# Patient Record
Sex: Female | Born: 1955 | Race: White | Hispanic: No | Smoking: Never smoker
Health system: Southern US, Community
[De-identification: ages and names within clinical notes are randomized; demographics above are authoritative.]

## PROBLEM LIST (undated history)

## (undated) DIAGNOSIS — G8929 Other chronic pain: Secondary | ICD-10-CM

## (undated) DIAGNOSIS — I10 Essential (primary) hypertension: Secondary | ICD-10-CM

## (undated) DIAGNOSIS — M545 Low back pain: Secondary | ICD-10-CM

## (undated) DIAGNOSIS — Z9889 Other specified postprocedural states: Secondary | ICD-10-CM

## (undated) DIAGNOSIS — K219 Gastro-esophageal reflux disease without esophagitis: Secondary | ICD-10-CM

## (undated) DIAGNOSIS — M1990 Unspecified osteoarthritis, unspecified site: Secondary | ICD-10-CM

## (undated) DIAGNOSIS — T783XXA Angioneurotic edema, initial encounter: Secondary | ICD-10-CM

## (undated) DIAGNOSIS — E7800 Pure hypercholesterolemia, unspecified: Secondary | ICD-10-CM

## (undated) DIAGNOSIS — R112 Nausea with vomiting, unspecified: Secondary | ICD-10-CM

## (undated) DIAGNOSIS — L509 Urticaria, unspecified: Secondary | ICD-10-CM

## (undated) HISTORY — DX: Urticaria, unspecified: L50.9

## (undated) HISTORY — PX: TONSILLECTOMY: SUR1361

## (undated) HISTORY — DX: Angioneurotic edema, initial encounter: T78.3XXA

---

## 1996-11-17 HISTORY — PX: DILATION AND CURETTAGE OF UTERUS: SHX78

## 1998-02-23 ENCOUNTER — Other Ambulatory Visit: Admission: RE | Admit: 1998-02-23 | Discharge: 1998-02-23 | Payer: Self-pay | Admitting: Obstetrics and Gynecology

## 1998-05-11 ENCOUNTER — Other Ambulatory Visit: Admission: RE | Admit: 1998-05-11 | Discharge: 1998-05-11 | Payer: Self-pay | Admitting: Obstetrics and Gynecology

## 1998-11-14 ENCOUNTER — Other Ambulatory Visit: Admission: RE | Admit: 1998-11-14 | Discharge: 1998-11-14 | Payer: Self-pay | Admitting: Obstetrics and Gynecology

## 1998-11-17 HISTORY — PX: VAGINAL HYSTERECTOMY: SUR661

## 1999-05-27 ENCOUNTER — Inpatient Hospital Stay (HOSPITAL_COMMUNITY): Admission: RE | Admit: 1999-05-27 | Discharge: 1999-05-28 | Payer: Self-pay | Admitting: Obstetrics and Gynecology

## 2000-03-06 ENCOUNTER — Other Ambulatory Visit: Admission: RE | Admit: 2000-03-06 | Discharge: 2000-03-06 | Payer: Self-pay | Admitting: Obstetrics and Gynecology

## 2000-04-01 ENCOUNTER — Other Ambulatory Visit: Admission: RE | Admit: 2000-04-01 | Discharge: 2000-04-01 | Payer: Self-pay | Admitting: Obstetrics and Gynecology

## 2000-04-01 ENCOUNTER — Encounter (INDEPENDENT_AMBULATORY_CARE_PROVIDER_SITE_OTHER): Payer: Self-pay

## 2000-12-22 ENCOUNTER — Other Ambulatory Visit: Admission: RE | Admit: 2000-12-22 | Discharge: 2000-12-22 | Payer: Self-pay | Admitting: Obstetrics and Gynecology

## 2001-04-28 ENCOUNTER — Other Ambulatory Visit: Admission: RE | Admit: 2001-04-28 | Discharge: 2001-04-28 | Payer: Self-pay | Admitting: Obstetrics and Gynecology

## 2001-11-11 ENCOUNTER — Other Ambulatory Visit: Admission: RE | Admit: 2001-11-11 | Discharge: 2001-11-11 | Payer: Self-pay | Admitting: Obstetrics and Gynecology

## 2002-05-11 ENCOUNTER — Other Ambulatory Visit: Admission: RE | Admit: 2002-05-11 | Discharge: 2002-05-11 | Payer: Self-pay | Admitting: Obstetrics and Gynecology

## 2002-12-30 ENCOUNTER — Other Ambulatory Visit: Admission: RE | Admit: 2002-12-30 | Discharge: 2002-12-30 | Payer: Self-pay | Admitting: Obstetrics and Gynecology

## 2003-08-04 ENCOUNTER — Other Ambulatory Visit: Admission: RE | Admit: 2003-08-04 | Discharge: 2003-08-04 | Payer: Self-pay | Admitting: Obstetrics and Gynecology

## 2003-11-18 HISTORY — PX: REFRACTIVE SURGERY: SHX103

## 2004-08-05 ENCOUNTER — Other Ambulatory Visit: Admission: RE | Admit: 2004-08-05 | Discharge: 2004-08-05 | Payer: Self-pay | Admitting: Obstetrics and Gynecology

## 2006-01-30 ENCOUNTER — Other Ambulatory Visit: Admission: RE | Admit: 2006-01-30 | Discharge: 2006-01-30 | Payer: Self-pay | Admitting: Obstetrics and Gynecology

## 2007-06-29 ENCOUNTER — Encounter: Admission: RE | Admit: 2007-06-29 | Discharge: 2007-06-29 | Payer: Self-pay | Admitting: Obstetrics and Gynecology

## 2008-01-03 ENCOUNTER — Encounter: Admission: RE | Admit: 2008-01-03 | Discharge: 2008-01-03 | Payer: Self-pay | Admitting: Obstetrics and Gynecology

## 2011-09-18 HISTORY — PX: KNEE ARTHROSCOPY W/ MENISCAL REPAIR: SHX1877

## 2012-06-03 ENCOUNTER — Encounter (HOSPITAL_COMMUNITY): Payer: Self-pay | Admitting: Pharmacy Technician

## 2012-06-07 ENCOUNTER — Other Ambulatory Visit: Payer: Self-pay | Admitting: Orthopedic Surgery

## 2012-06-08 ENCOUNTER — Encounter (HOSPITAL_COMMUNITY)
Admission: RE | Admit: 2012-06-08 | Discharge: 2012-06-08 | Disposition: A | Discharge status: Home / Self Care | Payer: Managed Care, Other (non HMO) | Source: Ambulatory Visit | Attending: Orthopedic Surgery | Admitting: Orthopedic Surgery

## 2012-06-08 ENCOUNTER — Encounter (HOSPITAL_COMMUNITY): Payer: Self-pay

## 2012-06-08 HISTORY — DX: Unspecified osteoarthritis, unspecified site: M19.90

## 2012-06-08 HISTORY — DX: Essential (primary) hypertension: I10

## 2012-06-08 HISTORY — DX: Nausea with vomiting, unspecified: R11.2

## 2012-06-08 HISTORY — DX: Pure hypercholesterolemia, unspecified: E78.00

## 2012-06-08 HISTORY — DX: Nausea with vomiting, unspecified: Z98.890

## 2012-06-08 LAB — DIFFERENTIAL
Basophils Relative: 1 % (ref 0–1)
Eosinophils Absolute: 0.4 10*3/uL (ref 0.0–0.7)
Monocytes Absolute: 0.8 10*3/uL (ref 0.1–1.0)
Monocytes Relative: 10 % (ref 3–12)
Neutrophils Relative %: 47 % (ref 43–77)

## 2012-06-08 LAB — CBC
HCT: 41.9 % (ref 36.0–46.0)
RBC: 4.64 MIL/uL (ref 3.87–5.11)
RDW: 13.1 % (ref 11.5–15.5)
WBC: 8.6 10*3/uL (ref 4.0–10.5)

## 2012-06-08 LAB — APTT: aPTT: 34 seconds (ref 24–37)

## 2012-06-08 LAB — COMPREHENSIVE METABOLIC PANEL
AST: 32 U/L (ref 0–37)
Albumin: 4.5 g/dL (ref 3.5–5.2)
Alkaline Phosphatase: 68 U/L (ref 39–117)
BUN: 13 mg/dL (ref 6–23)
CO2: 27 mEq/L (ref 19–32)
Chloride: 100 mEq/L (ref 96–112)
Potassium: 3.8 mEq/L (ref 3.5–5.1)
Total Bilirubin: 0.3 mg/dL (ref 0.3–1.2)

## 2012-06-08 LAB — ABO/RH: ABO/RH(D): B POS

## 2012-06-08 LAB — URINE MICROSCOPIC-ADD ON

## 2012-06-08 LAB — URINALYSIS, ROUTINE W REFLEX MICROSCOPIC
Bilirubin Urine: NEGATIVE
Glucose, UA: NEGATIVE mg/dL
Hgb urine dipstick: NEGATIVE
Ketones, ur: NEGATIVE mg/dL
pH: 6 (ref 5.0–8.0)

## 2012-06-08 LAB — TYPE AND SCREEN: ABO/RH(D): B POS

## 2012-06-08 NOTE — Pre-Procedure Instructions (Signed)
20 Jessica Williams  06/08/2012   Your procedure is scheduled on:  Monday July 29 @ 8:45  Report to [facility] at 6:45 AM.  Call this number if you have problems the morning of surgery: [phone]   Remember:   Do not eat food:After Midnight.  May have clear liquids:until Midnight .  Clear liquids include soda, tea, black coffee, apple or grape juice, broth.  Take these medicines the morning of surgery with A SIP OF WATER: Atenolol   Do not wear jewelry, make-up or nail polish.  Do not wear lotions, powders, or perfumes. You may wear deodorant.  Do not shave 48 hours prior to surgery. Men may shave face and neck.  Do not bring valuables to the hospital.  Contacts, dentures or bridgework may not be worn into surgery.  Leave suitcase in the car. After surgery it may be brought to your room.  For patients admitted to the hospital, checkout time is 11:00 AM the day of discharge.   Patients discharged the day of surgery will not be allowed to drive home.  Name and phone number of your driver: NA  Special Instructions: Incentive Spirometry - Practice and bring it with you on the day of surgery. and CHG Shower Use Special Wash: 1/2 bottle night before surgery and 1/2 bottle morning of surgery.   Please read over the following fact sheets that you were given: Pain Booklet, Coughing and Deep Breathing, Blood Transfusion Information, Total Joint Packet and Surgical Site Infection Prevention

## 2012-06-09 LAB — URINE CULTURE: Colony Count: NO GROWTH

## 2012-06-13 MED ORDER — CEFAZOLIN SODIUM-DEXTROSE 2-3 GM-% IV SOLR
2.0000 g | INTRAVENOUS | Status: AC
  Administered 2012-06-14: 2 g via INTRAVENOUS
  Filled 2012-06-13: qty 50

## 2012-06-14 ENCOUNTER — Inpatient Hospital Stay (HOSPITAL_COMMUNITY)
Admission: RE | Admit: 2012-06-14 | Discharge: 2012-06-16 | DRG: 470 | Disposition: A | Discharge status: Home Health | Payer: Managed Care, Other (non HMO) | Source: Ambulatory Visit | Attending: Orthopedic Surgery | Admitting: Orthopedic Surgery

## 2012-06-14 ENCOUNTER — Encounter (HOSPITAL_COMMUNITY): Payer: Self-pay | Admitting: Anesthesiology

## 2012-06-14 ENCOUNTER — Encounter (HOSPITAL_COMMUNITY)
Admission: RE | Disposition: A | Discharge status: Home Health | Payer: Self-pay | Source: Ambulatory Visit | Attending: Orthopedic Surgery

## 2012-06-14 ENCOUNTER — Ambulatory Visit (HOSPITAL_COMMUNITY): Payer: Managed Care, Other (non HMO) | Admitting: Anesthesiology

## 2012-06-14 ENCOUNTER — Encounter (HOSPITAL_COMMUNITY): Payer: Self-pay | Admitting: *Deleted

## 2012-06-14 DIAGNOSIS — Z01812 Encounter for preprocedural laboratory examination: Secondary | ICD-10-CM

## 2012-06-14 DIAGNOSIS — M1710 Unilateral primary osteoarthritis, unspecified knee: Principal | ICD-10-CM | POA: Diagnosis present

## 2012-06-14 DIAGNOSIS — K219 Gastro-esophageal reflux disease without esophagitis: Secondary | ICD-10-CM | POA: Diagnosis present

## 2012-06-14 DIAGNOSIS — M1711 Unilateral primary osteoarthritis, right knee: Secondary | ICD-10-CM

## 2012-06-14 DIAGNOSIS — I10 Essential (primary) hypertension: Secondary | ICD-10-CM | POA: Diagnosis present

## 2012-06-14 DIAGNOSIS — Z79899 Other long term (current) drug therapy: Secondary | ICD-10-CM

## 2012-06-14 DIAGNOSIS — Z01811 Encounter for preprocedural respiratory examination: Secondary | ICD-10-CM

## 2012-06-14 DIAGNOSIS — E7800 Pure hypercholesterolemia, unspecified: Secondary | ICD-10-CM | POA: Diagnosis present

## 2012-06-14 DIAGNOSIS — IMO0002 Reserved for concepts with insufficient information to code with codable children: Principal | ICD-10-CM | POA: Diagnosis present

## 2012-06-14 DIAGNOSIS — Z01818 Encounter for other preprocedural examination: Secondary | ICD-10-CM

## 2012-06-14 DIAGNOSIS — D62 Acute posthemorrhagic anemia: Secondary | ICD-10-CM | POA: Diagnosis not present

## 2012-06-14 HISTORY — DX: Gastro-esophageal reflux disease without esophagitis: K21.9

## 2012-06-14 HISTORY — PX: TOTAL KNEE ARTHROPLASTY: SHX125

## 2012-06-14 SURGERY — ARTHROPLASTY, KNEE, TOTAL
Anesthesia: General | Site: Knee | Laterality: Right | Wound class: Clean

## 2012-06-14 MED ORDER — SENNOSIDES-DOCUSATE SODIUM 8.6-50 MG PO TABS: 1.0000 | ORAL_TABLET | Freq: Every evening | ORAL | Status: DC | PRN

## 2012-06-14 MED ORDER — BUPIVACAINE-EPINEPHRINE 0.25% -1:200000 IJ SOLN
INTRAMUSCULAR | Status: DC | PRN
  Administered 2012-06-14: 20 mL

## 2012-06-14 MED ORDER — ATORVASTATIN CALCIUM 40 MG PO TABS
40.0000 mg | ORAL_TABLET | Freq: Every day | ORAL | Status: DC
  Administered 2012-06-14 – 2012-06-15 (×2): 40 mg via ORAL
  Filled 2012-06-14 (×3): qty 1

## 2012-06-14 MED ORDER — BISACODYL 5 MG PO TBEC: 5.0000 mg | DELAYED_RELEASE_TABLET | Freq: Every day | ORAL | Status: DC | PRN

## 2012-06-14 MED ORDER — METHOCARBAMOL 100 MG/ML IJ SOLN
500.0000 mg | Freq: Four times a day (QID) | INTRAVENOUS | Status: DC | PRN
  Administered 2012-06-14: 500 mg via INTRAVENOUS
  Filled 2012-06-14: qty 5

## 2012-06-14 MED ORDER — ONDANSETRON HCL 4 MG PO TABS: 4.0000 mg | ORAL_TABLET | Freq: Four times a day (QID) | ORAL | Status: DC | PRN

## 2012-06-14 MED ORDER — SODIUM CHLORIDE 0.9 % IR SOLN
Status: DC | PRN
  Administered 2012-06-14: 3000 mL
  Administered 2012-06-14: 1000 mL

## 2012-06-14 MED ORDER — ATENOLOL 50 MG PO TABS
50.0000 mg | ORAL_TABLET | Freq: Every day | ORAL | Status: DC
  Administered 2012-06-15 – 2012-06-16 (×2): 50 mg via ORAL
  Filled 2012-06-14 (×2): qty 1

## 2012-06-14 MED ORDER — BUPIVACAINE-EPINEPHRINE PF 0.25-1:200000 % IJ SOLN
INTRAMUSCULAR | Status: AC
  Filled 2012-06-14: qty 30

## 2012-06-14 MED ORDER — METOCLOPRAMIDE HCL 5 MG/ML IJ SOLN
5.0000 mg | Freq: Three times a day (TID) | INTRAMUSCULAR | Status: DC | PRN
  Filled 2012-06-14: qty 2

## 2012-06-14 MED ORDER — ONDANSETRON HCL 4 MG/2ML IJ SOLN
INTRAMUSCULAR | Status: DC | PRN
  Administered 2012-06-14: 4 mg via INTRAVENOUS

## 2012-06-14 MED ORDER — HYDROMORPHONE HCL PF 1 MG/ML IJ SOLN
0.5000 mg | INTRAMUSCULAR | Status: DC | PRN
  Administered 2012-06-14: 0.5 mg via INTRAVENOUS

## 2012-06-14 MED ORDER — BUPIVACAINE-EPINEPHRINE PF 0.5-1:200000 % IJ SOLN
INTRAMUSCULAR | Status: DC | PRN
  Administered 2012-06-14: 30 mL

## 2012-06-14 MED ORDER — ONDANSETRON HCL 4 MG/2ML IJ SOLN
4.0000 mg | Freq: Four times a day (QID) | INTRAMUSCULAR | Status: DC | PRN
  Administered 2012-06-14: 4 mg via INTRAVENOUS
  Filled 2012-06-14: qty 2

## 2012-06-14 MED ORDER — DOCUSATE SODIUM 100 MG PO CAPS
100.0000 mg | ORAL_CAPSULE | Freq: Two times a day (BID) | ORAL | Status: DC
  Administered 2012-06-14 – 2012-06-16 (×5): 100 mg via ORAL
  Filled 2012-06-14 (×6): qty 1

## 2012-06-14 MED ORDER — DEXAMETHASONE SODIUM PHOSPHATE 10 MG/ML IJ SOLN
INTRAMUSCULAR | Status: DC | PRN
  Administered 2012-06-14: 4 mg via INTRAVENOUS

## 2012-06-14 MED ORDER — METHOCARBAMOL 500 MG PO TABS
500.0000 mg | ORAL_TABLET | Freq: Four times a day (QID) | ORAL | Status: DC | PRN
  Administered 2012-06-15 – 2012-06-16 (×3): 500 mg via ORAL
  Filled 2012-06-14 (×4): qty 1

## 2012-06-14 MED ORDER — HYDROMORPHONE HCL PF 1 MG/ML IJ SOLN
1.0000 mg | INTRAMUSCULAR | Status: DC | PRN
  Administered 2012-06-14 – 2012-06-15 (×4): 1 mg via INTRAVENOUS
  Administered 2012-06-15: 0.5 mg via INTRAVENOUS
  Filled 2012-06-14 (×5): qty 1

## 2012-06-14 MED ORDER — MIDAZOLAM HCL 5 MG/5ML IJ SOLN
INTRAMUSCULAR | Status: DC | PRN
  Administered 2012-06-14: 2 mg via INTRAVENOUS

## 2012-06-14 MED ORDER — HYDROMORPHONE HCL PF 1 MG/ML IJ SOLN
0.2500 mg | INTRAMUSCULAR | Status: DC | PRN
  Administered 2012-06-14 (×4): 0.5 mg via INTRAVENOUS

## 2012-06-14 MED ORDER — CELECOXIB 200 MG PO CAPS
200.0000 mg | ORAL_CAPSULE | Freq: Two times a day (BID) | ORAL | Status: DC
  Administered 2012-06-14 – 2012-06-16 (×5): 200 mg via ORAL
  Filled 2012-06-14 (×6): qty 1

## 2012-06-14 MED ORDER — ACETAMINOPHEN 10 MG/ML IV SOLN
1000.0000 mg | Freq: Four times a day (QID) | INTRAVENOUS | Status: AC
  Administered 2012-06-14 – 2012-06-15 (×4): 1000 mg via INTRAVENOUS
  Filled 2012-06-14 (×4): qty 100

## 2012-06-14 MED ORDER — ACETAMINOPHEN 10 MG/ML IV SOLN
INTRAVENOUS | Status: DC | PRN
  Administered 2012-06-14: 1000 mg via INTRAVENOUS

## 2012-06-14 MED ORDER — ACETAMINOPHEN 650 MG RE SUPP: 650.0000 mg | Freq: Four times a day (QID) | RECTAL | Status: DC | PRN

## 2012-06-14 MED ORDER — LISINOPRIL 10 MG PO TABS
10.0000 mg | ORAL_TABLET | Freq: Every day | ORAL | Status: DC
  Administered 2012-06-14 – 2012-06-16 (×3): 10 mg via ORAL
  Filled 2012-06-14 (×3): qty 1

## 2012-06-14 MED ORDER — BUPIVACAINE 0.25 % ON-Q PUMP SINGLE CATH 300ML
INJECTION | Status: DC | PRN
  Administered 2012-06-14: 300 mL

## 2012-06-14 MED ORDER — ACETAMINOPHEN 10 MG/ML IV SOLN
INTRAVENOUS | Status: AC
  Filled 2012-06-14: qty 100

## 2012-06-14 MED ORDER — BUPIVACAINE ON-Q PAIN PUMP (FOR ORDER SET NO CHG)
INJECTION | Status: DC
  Filled 2012-06-14: qty 1

## 2012-06-14 MED ORDER — LACTATED RINGERS IV SOLN
INTRAVENOUS | Status: DC
  Administered 2012-06-14: 08:00:00 via INTRAVENOUS

## 2012-06-14 MED ORDER — DIPHENHYDRAMINE HCL 12.5 MG/5ML PO ELIX
12.5000 mg | ORAL_SOLUTION | ORAL | Status: DC | PRN
  Administered 2012-06-15 (×2): 25 mg via ORAL
  Filled 2012-06-14 (×2): qty 5

## 2012-06-14 MED ORDER — PHENOL 1.4 % MT LIQD: 1.0000 | OROMUCOSAL | Status: DC | PRN

## 2012-06-14 MED ORDER — ALUM & MAG HYDROXIDE-SIMETH 200-200-20 MG/5ML PO SUSP: 30.0000 mL | ORAL | Status: DC | PRN

## 2012-06-14 MED ORDER — ZOLPIDEM TARTRATE 5 MG PO TABS: 5.0000 mg | ORAL_TABLET | Freq: Every evening | ORAL | Status: DC | PRN

## 2012-06-14 MED ORDER — ACETAMINOPHEN 325 MG PO TABS: 650.0000 mg | ORAL_TABLET | Freq: Four times a day (QID) | ORAL | Status: DC | PRN

## 2012-06-14 MED ORDER — MENTHOL 3 MG MT LOZG: 1.0000 | LOZENGE | OROMUCOSAL | Status: DC | PRN

## 2012-06-14 MED ORDER — ENOXAPARIN SODIUM 30 MG/0.3ML ~~LOC~~ SOLN
30.0000 mg | Freq: Two times a day (BID) | SUBCUTANEOUS | Status: DC
  Administered 2012-06-14 – 2012-06-16 (×4): 30 mg via SUBCUTANEOUS
  Filled 2012-06-14 (×5): qty 0.3

## 2012-06-14 MED ORDER — LACTATED RINGERS IV SOLN
INTRAVENOUS | Status: DC | PRN
  Administered 2012-06-14 (×2): via INTRAVENOUS

## 2012-06-14 MED ORDER — HYDROMORPHONE HCL PF 1 MG/ML IJ SOLN
INTRAMUSCULAR | Status: AC
  Filled 2012-06-14: qty 1

## 2012-06-14 MED ORDER — ONDANSETRON HCL 4 MG/2ML IJ SOLN: 4.0000 mg | Freq: Once | INTRAMUSCULAR | Status: DC | PRN

## 2012-06-14 MED ORDER — SODIUM CHLORIDE 0.9 % IV SOLN
INTRAVENOUS | Status: DC
Start: 2012-06-14 — End: 2012-06-16
  Administered 2012-06-14: 18:00:00 via INTRAVENOUS

## 2012-06-14 MED ORDER — PROPOFOL 10 MG/ML IV EMUL
INTRAVENOUS | Status: DC | PRN
  Administered 2012-06-14: 140 mL via INTRAVENOUS

## 2012-06-14 MED ORDER — FLEET ENEMA 7-19 GM/118ML RE ENEM: 1.0000 | ENEMA | Freq: Once | RECTAL | Status: AC | PRN

## 2012-06-14 MED ORDER — FENTANYL CITRATE 0.05 MG/ML IJ SOLN
INTRAMUSCULAR | Status: DC | PRN
  Administered 2012-06-14: 100 ug via INTRAVENOUS
  Administered 2012-06-14 (×2): 50 ug via INTRAVENOUS

## 2012-06-14 MED ORDER — DEXTROSE 5 % IV SOLN
INTRAVENOUS | Status: DC | PRN
  Administered 2012-06-14 (×2): via INTRAVENOUS

## 2012-06-14 MED ORDER — OXYCODONE HCL 10 MG PO TB12
10.0000 mg | ORAL_TABLET | Freq: Two times a day (BID) | ORAL | Status: DC
  Administered 2012-06-14 – 2012-06-16 (×5): 10 mg via ORAL
  Filled 2012-06-14 (×5): qty 1

## 2012-06-14 MED ORDER — OXYCODONE HCL 5 MG PO TABS
5.0000 mg | ORAL_TABLET | ORAL | Status: DC | PRN
  Administered 2012-06-14 – 2012-06-16 (×11): 10 mg via ORAL
  Filled 2012-06-14 (×11): qty 2

## 2012-06-14 MED ORDER — METOCLOPRAMIDE HCL 10 MG PO TABS: 5.0000 mg | ORAL_TABLET | Freq: Three times a day (TID) | ORAL | Status: DC | PRN

## 2012-06-14 MED ORDER — LIDOCAINE HCL (CARDIAC) 20 MG/ML IV SOLN
INTRAVENOUS | Status: DC | PRN
  Administered 2012-06-14: 100 mg via INTRAVENOUS

## 2012-06-14 MED ORDER — HYDROMORPHONE HCL PF 1 MG/ML IJ SOLN
INTRAMUSCULAR | Status: AC
  Administered 2012-06-14: 0.5 mg via INTRAVENOUS
  Filled 2012-06-14: qty 1

## 2012-06-14 MED ORDER — BUPIVACAINE 0.25 % ON-Q PUMP SINGLE CATH 300ML
300.0000 mL | INJECTION | Status: DC
  Filled 2012-06-14: qty 300

## 2012-06-14 MED ORDER — CEFAZOLIN SODIUM 1-5 GM-% IV SOLN
1.0000 g | Freq: Four times a day (QID) | INTRAVENOUS | Status: AC
  Administered 2012-06-14 (×2): 1 g via INTRAVENOUS
  Filled 2012-06-14 (×2): qty 50

## 2012-06-14 SURGICAL SUPPLY — 56 items
BANDAGE ESMARK 6X9 LF (GAUZE/BANDAGES/DRESSINGS) ×1 IMPLANT
BLADE SAGITTAL 13X1.27X60 (BLADE) ×2 IMPLANT
BLADE SAW SGTL 83.5X18.5 (BLADE) ×2 IMPLANT
BNDG ESMARK 6X9 LF (GAUZE/BANDAGES/DRESSINGS) ×2
BOWL SMART MIX CTS (DISPOSABLE) ×2 IMPLANT
CATH KIT ON Q 5IN SLV (PAIN MANAGEMENT) ×2 IMPLANT
CEMENT BONE SIMPLEX SPEEDSET (Cement) ×4 IMPLANT
CLOTH BEACON ORANGE TIMEOUT ST (SAFETY) ×2 IMPLANT
COVER BACK TABLE 24X17X13 BIG (DRAPES) IMPLANT
COVER SURGICAL LIGHT HANDLE (MISCELLANEOUS) ×2 IMPLANT
CUFF TOURNIQUET SINGLE 34IN LL (TOURNIQUET CUFF) ×2 IMPLANT
DRAPE EXTREMITY T 121X128X90 (DRAPE) ×2 IMPLANT
DRAPE INCISE IOBAN 66X45 STRL (DRAPES) ×4 IMPLANT
DRAPE PROXIMA HALF (DRAPES) ×2 IMPLANT
DRAPE U-SHAPE 47X51 STRL (DRAPES) ×2 IMPLANT
DRSG ADAPTIC 3X8 NADH LF (GAUZE/BANDAGES/DRESSINGS) ×2 IMPLANT
DRSG PAD ABDOMINAL 8X10 ST (GAUZE/BANDAGES/DRESSINGS) ×2 IMPLANT
DURAPREP 26ML APPLICATOR (WOUND CARE) ×4 IMPLANT
ELECT REM PT RETURN 9FT ADLT (ELECTROSURGICAL) ×2
ELECTRODE REM PT RTRN 9FT ADLT (ELECTROSURGICAL) ×1 IMPLANT
EVACUATOR 1/8 PVC DRAIN (DRAIN) ×2 IMPLANT
GLOVE BIOGEL M 7.0 STRL (GLOVE) ×2 IMPLANT
GLOVE BIOGEL PI IND STRL 7.5 (GLOVE) ×1 IMPLANT
GLOVE BIOGEL PI IND STRL 8.5 (GLOVE) ×1 IMPLANT
GLOVE BIOGEL PI INDICATOR 7.5 (GLOVE) ×1
GLOVE BIOGEL PI INDICATOR 8.5 (GLOVE) ×1
GLOVE SURG ORTHO 8.0 STRL STRW (GLOVE) ×2 IMPLANT
GOWN PREVENTION PLUS XLARGE (GOWN DISPOSABLE) ×4 IMPLANT
GOWN STRL NON-REIN LRG LVL3 (GOWN DISPOSABLE) ×2 IMPLANT
HANDPIECE INTERPULSE COAX TIP (DISPOSABLE) ×1
HOOD PEEL AWAY FACE SHEILD DIS (HOOD) ×6 IMPLANT
KIT BASIN OR (CUSTOM PROCEDURE TRAY) ×2 IMPLANT
KIT ROOM TURNOVER OR (KITS) ×2 IMPLANT
MANIFOLD NEPTUNE II (INSTRUMENTS) ×2 IMPLANT
NEEDLE 22X1 1/2 (OR ONLY) (NEEDLE) ×2 IMPLANT
NS IRRIG 1000ML POUR BTL (IV SOLUTION) ×2 IMPLANT
PACK TOTAL JOINT (CUSTOM PROCEDURE TRAY) ×2 IMPLANT
PAD ARMBOARD 7.5X6 YLW CONV (MISCELLANEOUS) ×4 IMPLANT
PADDING CAST COTTON 6X4 STRL (CAST SUPPLIES) ×2 IMPLANT
POSITIONER HEAD PRONE TRACH (MISCELLANEOUS) ×2 IMPLANT
SET HNDPC FAN SPRY TIP SCT (DISPOSABLE) ×1 IMPLANT
SPONGE GAUZE 4X4 12PLY (GAUZE/BANDAGES/DRESSINGS) ×2 IMPLANT
STAPLER VISISTAT 35W (STAPLE) ×2 IMPLANT
SUCTION FRAZIER TIP 10 FR DISP (SUCTIONS) ×2 IMPLANT
SUT BONE WAX W31G (SUTURE) ×2 IMPLANT
SUT VIC AB 0 CTB1 27 (SUTURE) ×4 IMPLANT
SUT VIC AB 1 CT1 27 (SUTURE) ×1
SUT VIC AB 1 CT1 27XBRD ANBCTR (SUTURE) ×1 IMPLANT
SUT VIC AB 2-0 CT1 27 (SUTURE) ×2
SUT VIC AB 2-0 CT1 TAPERPNT 27 (SUTURE) ×2 IMPLANT
SUT VLOC 180 0 24IN GS25 (SUTURE) ×2 IMPLANT
SYR CONTROL 10ML LL (SYRINGE) ×2 IMPLANT
TOWEL OR 17X24 6PK STRL BLUE (TOWEL DISPOSABLE) ×2 IMPLANT
TOWEL OR 17X26 10 PK STRL BLUE (TOWEL DISPOSABLE) ×2 IMPLANT
TRAY FOLEY CATH 14FR (SET/KITS/TRAYS/PACK) ×2 IMPLANT
WATER STERILE IRR 1000ML POUR (IV SOLUTION) ×2 IMPLANT

## 2012-06-14 NOTE — H&P (Signed)
Jessica Williams MRN:  [id number] DOB/SEX:  30-Nov-1955/female  CHIEF COMPLAINT:  Painful right Knee  HISTORY: Patient is a 56 y.o. female presented with a history of pain in the right knee. Onset of symptoms was gradual starting several years ago with gradually worsening course since that time. The patient noted no past surgery on the right knee. Prior procedures on the knee include arthroscopy. Patient has been treated conservatively with over-the-counter NSAIDs and activity modification. Patient currently rates pain in the knee at 9 out of 10 with activity. There is pain at night.  PAST MEDICAL HISTORY: There are no active problems to display for this patient.  Past Medical History  Diagnosis Date  . PONV (postoperative nausea and vomiting)   . Hypertension   . Hypercholesterolemia   . Arthritis    Past Surgical History  Procedure Date  . Abdominal hysterectomy   . Tonsillectomy   . Dilation and curettage of uterus   . Knee arthroscopy     Right  . Eye surgery     LASIK     MEDICATIONS:   Prescriptions prior to admission  Medication Sig Dispense Refill  . atenolol (TENORMIN) 50 MG tablet Take 50 mg by mouth daily.      Marland Kitchen atorvastatin (LIPITOR) 40 MG tablet Take 40 mg by mouth daily.      . Calcium 600-200 MG-UNIT per tablet Take 1 tablet by mouth daily.      . diclofenac (VOLTAREN) 75 MG EC tablet Take 75 mg by mouth daily as needed. Last dose on 06/04/12 in prep for surgery      . ibuprofen (ADVIL,MOTRIN) 200 MG tablet Take 200 mg by mouth every 6 (six) hours as needed. For arthritis .      . lisinopril (PRINIVIL,ZESTRIL) 10 MG tablet Take 10 mg by mouth daily.        ALLERGIES:  No Known Allergies  REVIEW OF SYSTEMS:  Pertinent items are noted in HPI.   FAMILY HISTORY:  History reviewed. No pertinent family history.  SOCIAL HISTORY:   History  Substance Use Topics  . Smoking status: Never Smoker   . Smokeless tobacco: Not on file  . Alcohol Use: No       EXAMINATION:  Vital signs in last 24 hours: Temp:  [97.3 F (36.3 C)] 97.3 F (36.3 C) (07/29 0713) Pulse Rate:  [78] 78  (07/29 0713) Resp:  [18] 18  (07/29 0713) BP: (130)/(89) 130/89 mmHg (07/29 0713) SpO2:  [94 %] 94 % (07/29 0713)  General appearance: alert, cooperative and no distress Lungs: clear to auscultation bilaterally Heart: regular rate and rhythm, S1, S2 normal, no murmur, click, rub or gallop Abdomen: soft, non-tender; bowel sounds normal; no masses,  no organomegaly Extremities: extremities normal, atraumatic, no cyanosis or edema and Homans sign is negative, no sign of DVT Pulses: 2+ and symmetric Skin: Skin color, texture, turgor normal. No rashes or lesions Neurologic: Alert and oriented X 3, normal strength and tone. Normal symmetric reflexes. Normal coordination and gait  Musculoskeletal:  ROM 0-110, Ligaments 0-115,  Imaging Review Plain radiographs demonstrate severe degenerative joint disease of the right knee. The overall alignment is mild varus. The bone quality appears to be good for age and reported activity level.  Assessment/Plan: End stage arthritis, right knee   The patient history, physical examination and imaging studies are consistent with advanced degenerative joint disease of the right knee. The patient has failed conservative treatment.  The clearance notes were reviewed.  After discussion  with the patient it was felt that Total Knee Replacement was indicated. The procedure,  risks, and benefits of total knee arthroplasty were presented and reviewed. The risks including but not limited to aseptic loosening, infection, blood clots, vascular injury, stiffness, patella tracking problems complications among others were discussed. The patient acknowledged the explanation, agreed to proceed with the plan.  Jessica Williams 06/14/2012, 7:22 AM

## 2012-06-14 NOTE — Anesthesia Preprocedure Evaluation (Addendum)
Anesthesia Evaluation  Patient identified by MRN, date of birth, ID band Patient awake    Reviewed: Allergy & Precautions, H&P , NPO status , Patient's Chart, lab work & pertinent test results  History of Anesthesia Complications (+) PONV  Airway Mallampati: II TM Distance: >3 FB Neck ROM: Full    Dental  (+) Teeth Intact and Dental Advisory Given   Pulmonary neg pulmonary ROS,      *RADIOLOGY REPORT*   Clinical Data: Hypertension, elevated cholesterol, arthritis, preoperative evaluation for a total knee replacement   CHEST - 2 VIEW   Comparison: 10/15/2005   Findings: Normal heart size and vascularity.  Negative for pneumonia, collapse, consolidation, effusion or pneumothorax. Trachea midline.  Degenerative changes of the spine.   IMPRESSION: No acute chest finding.  Stable exam        Pulmonary exam normal       Cardiovascular hypertension, Pt. on medications and Pt. on home beta blockers negative cardio ROS  Rhythm:Regular Rate:Normal  Took Atenolol @ 03:30 06/14/12   Neuro/Psych negative neurological ROS     GI/Hepatic negative GI ROS, Neg liver ROS,   Endo/Other  negative endocrine ROS  Renal/GU negative Renal ROS     Musculoskeletal  (+) Arthritis -, Osteoarthritis,    Abdominal Normal abdominal exam  (+)   Peds  Hematology negative hematology ROS (+)   Anesthesia Other Findings Bilateral Bridgework on lower incisors  Reproductive/Obstetrics negative OB ROS                        Anesthesia Physical Anesthesia Plan  ASA: II  Anesthesia Plan: General   Post-op Pain Management: MAC Combined w/ Regional for Post-op pain   Induction: Intravenous  Airway Management Planned: LMA  Additional Equipment:   Intra-op Plan:   Post-operative Plan: Extubation in OR  Informed Consent: I have reviewed the patients History and Physical, chart, labs and discussed the  procedure including the risks, benefits and alternatives for the proposed anesthesia with the patient or authorized representative who has indicated his/her understanding and acceptance.   Dental advisory given, History available from chart only and Only emergency history available  Plan Discussed with: CRNA and Surgeon  Anesthesia Plan Comments:        Anesthesia Quick Evaluation

## 2012-06-14 NOTE — Op Note (Signed)
TOTAL KNEE REPLACEMENT OPERATIVE NOTE:  06/14/2012  1:27 PM  PATIENT:  Jessica Williams  56 y.o. female  PRE-OPERATIVE DIAGNOSIS:  osteoarthritis right knee  POST-OPERATIVE DIAGNOSIS:  osteoarthritis right knee  PROCEDURE:  Procedure(s): TOTAL KNEE ARTHROPLASTY  SURGEON:  Surgeon(s): Raymon Mutton, MD  PHYSICIAN ASSISTANT: [name], [facility]  ANESTHESIA:   general  DRAINS: Hemovac and On-Q Marcaine Pain Pump  SPECIMEN: None  COUNTS:  Correct  TOURNIQUET:   Total Tourniquet Time Documented: Thigh (Right) - 43 minutes  DICTATION:  Indication for procedure:    The patient is a 56 y.o. female who has failed conservative treatment for osteoarthritis right knee.  Informed consent was obtained prior to anesthesia. The risks versus benefits of the operation were explain and in a way the patient can, and did, understand.   Description of procedure:     The patient was taken to the operating room and placed under anesthesia.  The patient was positioned in the usual fashion taking care that all body parts were adequately padded and/or protected.  I foley catheter was placed.  A tourniquet was applied and the leg prepped and draped in the usual sterile fashion.  The extremity was exsanguinated with the esmarch and tourniquet inflated to 350 mmHg.  Pre-operative range of motion was normal.  The knee was in 5 degree of mild varus.  A midline incision approximately 6-7 inches long was made with a #10 blade.  A new blade was used to make a parapatellar arthrotomy going 2-3 cm into the quadriceps tendon, over the patella, and alongside the medial aspect of the patellar tendon.  A synovectomy was then performed with the #10 blade and forceps. I then elevated the deep MCL off the medial tibial metaphysis subperiosteally around to the semimembranosus attachment.    I everted the patella and used calipers to measure patellar thickness.  I used the reamer to ream down to appropriate thickness to  recreate the native thickness.  I then removed excess bone with the rongeur and sagittal saw.  I used the appropriately sized template and drilled the three lug holes.  I then put the trial in place and measured the thickness with the calipers to ensure recreation of the native thickness.  The trial was then removed and the patella subluxed and the knee brought into flexion.  A homan retractor was place to retract and protect the patella and lateral structures.  A Z-retractor was place medially to protect the medial structures.  The extra-medullary alignment system was used to make cut the tibial articular surface perpendicular to the anamotic axis of the tibia and in 3 degrees of posterior slope.  The cut surface and alignment jig was removed.  I then used the intramedullary alignment guide to make a 6 valgus cut on the distal femur.  I then marked out the epicondylar axis on the distal femur.  The posterior condylar axis measured 3 degrees.  I then used the anterior referencing sizer and measured the femur to be a size D.  The 4-In-1 cutting block was screwed into place in external rotation matching the posterior condylar angle, making our cuts perpendicular to the epicondylar axis.  Anterior, posterior and chamfer cuts were made with the sagittal saw.  The cutting block and cut pieces were removed.  A lamina spreader was placed in 90 degrees of flexion.  The ACL, PCL, menisci, and posterior condylar osteophytes were removed.  A 10 mm spacer blocked was found to offer good flexion and  extension gap balance after minimal in degree releasing.   The scoop retractor was then placed and the femoral finishing block was pinned in place.  The small sagittal saw was used as well as the lug drill to finish the femur.  The block and cut surfaces were removed and the medullary canal hole filled with autograft bone from the cut pieces.  The tibia was delivered forward in deep flexion and external rotation.  A size 4  tray was selected and pinned into place centered on the medial 1/3 of the tibial tubercle.  The reamer and keel was used to prepare the tibia through the tray.    I then trialed with the size D femur, size 4 tibia, a 10 mm insert and the 32 patella.  I had excellent flexion/extension gap balance, excellent patella tracking.  Flexion was full and beyond 120 degrees; extension was zero.  These components were chosen and the staff opened them to me on the back table while the knee was lavaged copiously and the cement mixed.  I cemented in the components and removed all excess cement.  The polyethylene tibial component was snapped into place and the knee placed in extension while cement was hardening.  The capsule was infilltrated with 20cc of .25% Marcaine with epinephrine.  A hemovac was place in the joint exiting superolaterally.  A pain pump was place superomedially superficial to the arthrotomy.  Once the cement was hard, the tourniquet was let down.  Hemostasis was obtained.  The arthrotomy was closed with figure-8 #1 vicryl sutures.  The deep soft tissues were closed with #0 vicryls and the subcuticular layer closed with a running #2-0 vicryl.  The skin was reapproximated and closed with skin staples.  The wound was dressed with xeroform, 4 x4's, 2 ABD sponges, a single layer of webril and a TED stocking.   The patient was then awakened, extubated, and taken to the recovery room in stable condition.  BLOOD LOSS:  300cc DRAINS: 1 hemovac, 1 pain catheter COMPLICATIONS:  None.  PLAN OF CARE: Admit to inpatient   PATIENT DISPOSITION:  PACU - hemodynamically stable.   Delay start of Pharmacological VTE agent (>24hrs) due to surgical blood loss or risk of bleeding:  not applicable  Please fax a copy of this op note to my office at [phone] (please only include page 1 and 2 of the Case Information op note)

## 2012-06-14 NOTE — Preoperative (Addendum)
Beta Blockers   Took Atenolol @ 03:30 06/14/12

## 2012-06-14 NOTE — Anesthesia Procedure Notes (Addendum)
Anesthesia Regional Block:  Femoral nerve block  Pre-Anesthetic Checklist: ,, timeout performed, Correct Patient, Correct Site, Correct Laterality, Correct Procedure, Correct Position, site marked, Risks and benefits discussed,  Surgical consent,  Pre-op evaluation,  At surgeon's request and post-op pain management  Laterality: Right  Prep: chloraprep       Needles:  Injection technique: Single-shot  Needle Type: Echogenic Stimulator Needle          Additional Needles:  Procedures: ultrasound guided and nerve stimulator Femoral nerve block  Nerve Stimulator or Paresthesia:  Response: 0.4 mA,   Additional Responses:   Narrative:  Start time: 06/14/2012 8:25 AM End time: 06/14/2012 8:40 AM Injection made incrementally with aspirations every 5 mL. Anesthesiologist: Arta Bruce MD  Additional Notes: Monitors applied. Patient sedated. Sterile prep and drape,hand hygiene and sterile gloves were used. Relevant anatomy identified.Needle position confirmed.Local anesthetic injected incrementally after negative aspiration. Local anesthetic spread visualized around nerve(s). Vascular puncture avoided. No complications. Image printed for medical record.The patient tolerated the procedure well.       Femoral nerve block Procedure Name: LMA Insertion Date/Time: 06/14/2012 8:51 AM Performed by: Tyrone Nine Pre-anesthesia Checklist: Patient identified, Timeout performed, Emergency Drugs available, Suction available and Patient being monitored Patient Re-evaluated:Patient Re-evaluated prior to inductionOxygen Delivery Method: Circle system utilized Preoxygenation: Pre-oxygenation with 100% oxygen Intubation Type: IV induction Ventilation: Mask ventilation without difficulty LMA: LMA with gastric port inserted LMA Size: 4.0 Number of attempts: 1 Placement Confirmation: positive ETCO2 and breath sounds checked- equal and bilateral Tube secured with: Tape Dental Injury: Teeth and  Oropharynx as per pre-operative assessment

## 2012-06-14 NOTE — Anesthesia Postprocedure Evaluation (Signed)
Anesthesia Post Note  Patient: Jessica Williams  Procedure(s) Performed: Procedure(s) (LRB): TOTAL KNEE ARTHROPLASTY (Right)  Anesthesia type: general  Patient location: PACU  Post pain: Pain level controlled  Post assessment: Patient's Cardiovascular Status Stable  Last Vitals:  Filed Vitals:   06/14/12 1230  BP: 122/77  Pulse: 92  Temp:   Resp: 13    Post vital signs: Reviewed and stable  Level of consciousness: sedated  Complications: No apparent anesthesia complications

## 2012-06-14 NOTE — Plan of Care (Signed)
Problem: Consults Goal: Diagnosis- Total Joint Replacement Primary Total Knee Right     

## 2012-06-14 NOTE — Transfer of Care (Signed)
Immediate Anesthesia Transfer of Care Note  Patient: Jessica Williams  Procedure(s) Performed: Procedure(s) (LRB): TOTAL KNEE ARTHROPLASTY (Right)  Patient Location: PACU  Anesthesia Type: General  Level of Consciousness: awake, alert , oriented and patient cooperative  Airway & Oxygen Therapy: Patient Spontanous Breathing and Patient connected to nasal cannula oxygen  Post-op Assessment: Report given to PACU RN and Post -op Vital signs reviewed and stable  Post vital signs: Reviewed and stable  Complications: No apparent anesthesia complications

## 2012-06-14 NOTE — Progress Notes (Signed)
Orthopedic Tech Progress Note Patient Details:  Jessica Williams October 28, 1956 [id number]  CPM Right Knee CPM Right Knee: On Right Knee Flexion (Degrees): 90  Right Knee Extension (Degrees): 0    Juquan Reznick T 06/14/2012, 3:08 PM

## 2012-06-14 NOTE — Progress Notes (Signed)
UR COMPLETED  

## 2012-06-15 ENCOUNTER — Encounter (HOSPITAL_COMMUNITY): Payer: Self-pay | Admitting: Orthopedic Surgery

## 2012-06-15 LAB — CBC
HCT: 30.3 % — ABNORMAL LOW (ref 36.0–46.0)
Hemoglobin: 10.4 g/dL — ABNORMAL LOW (ref 12.0–15.0)
MCH: 31.2 pg (ref 26.0–34.0)
MCV: 91 fL (ref 78.0–100.0)
RBC: 3.33 MIL/uL — ABNORMAL LOW (ref 3.87–5.11)

## 2012-06-15 LAB — BASIC METABOLIC PANEL
BUN: 10 mg/dL (ref 6–23)
CO2: 24 mEq/L (ref 19–32)
Calcium: 8.4 mg/dL (ref 8.4–10.5)
Creatinine, Ser: 0.51 mg/dL (ref 0.50–1.10)
Glucose, Bld: 123 mg/dL — ABNORMAL HIGH (ref 70–99)
Sodium: 136 mEq/L (ref 135–145)

## 2012-06-15 NOTE — Progress Notes (Signed)
Read and agree with the above.  Jessica Williams, OTR/L Pager: [phone] 06/15/2012

## 2012-06-15 NOTE — Progress Notes (Signed)
  Jessica Spurling, MD   Jessica Cabal, PA-C [address] [place], [place], [place]  [address]                             [phone]   PROGRESS NOTE  Subjective:  negative for Chest Pain  negative for Shortness of Breath  negative for Nausea/Vomiting   negative for Calf Pain  negative for Bowel Movement   Tolerating Diet: yes         Patient reports pain as 3 on 0-10 scale.    Objective: Vital signs in last 24 hours:   Patient Vitals for the past 24 hrs:  BP Temp Pulse Resp SpO2  06/15/12 1139 116/63 mmHg - 110  - -  06/15/12 0635 115/71 mmHg 98 F (36.7 C) 91  18  100 %  06/14/12 2203 121/86 mmHg 98.3 F (36.8 C) 86  18  99 %  06/14/12 1600 - - - 18  -    @flow {1959:LAST@   Intake/Output from previous day:   07/29 0701 - 07/30 0700 In: 1450 [I.V.:1450] Out: 900 [Urine:325; Drains:475]   Intake/Output this shift:       Intake/Output      07/29 0701 - 07/30 0700 07/30 0701 - 07/31 0700   I.V. 1450    Total Intake 1450    Urine 325    Drains 475    Blood 100    Total Output 900    Net +550            LABORATORY DATA:  Basename 06/15/12 0545  WBC 11.5*  HGB 10.4*  HCT 30.3*  PLT 151    Basename 06/15/12 0545  NA 136  K 4.2  CL 100  CO2 24  BUN 10  CREATININE 0.51  GLUCOSE 123*  CALCIUM 8.4   Lab Results  Component Value Date   INR 1.07 06/08/2012    Examination:  General appearance: alert, cooperative and no distress Extremities: extremities normal, atraumatic, no cyanosis or edema and Homans sign is negative, no sign of DVT  Wound Exam: clean, dry, intact   Drainage:  None: wound tissue dry  Motor Exam: EHL and FHL Intact  Sensory Exam: Deep Peroneal normal  Vascular Exam:    Assessment:    1 Day Post-Op  Procedure(s) (LRB): TOTAL KNEE ARTHROPLASTY (Right)  ADDITIONAL DIAGNOSIS:  Active Problems:  * No active hospital problems. *   Acute Blood Loss Anemia   Plan: Physical Therapy as ordered Weight Bearing as Tolerated  (WBAT)  DVT Prophylaxis:  Lovenox  DISCHARGE PLAN: Home  DISCHARGE NEEDS: HHPT, CPM, Walker and 3-in-1 comode seat         Jessica Williams 06/15/2012, 3:20 PM

## 2012-06-15 NOTE — Evaluation (Signed)
Physical Therapy Evaluation Patient Details Name: Jessica Williams MRN: [id number] DOB: 07/05/1956 Today's Date: 06/15/2012 Time: 4098-1191 PT Time Calculation (min): 25 min  PT Assessment / Plan / Recommendation Clinical Impression  Pt is a 56 y/o female admitted s/p right TKA along with the below PT problem list.  Pt would benefit from acute PT to maximize independence and facilitate d/c home with HHPT.    PT Assessment  Patient needs continued PT services    Follow Up Recommendations  Home health PT    Barriers to Discharge None      Equipment Recommendations  None recommended by PT    Recommendations for Other Services     Frequency 7X/week    Precautions / Restrictions Precautions Precautions: Knee Precaution Booklet Issued: No Restrictions Weight Bearing Restrictions: Yes RLE Weight Bearing: Weight bearing as tolerated   Pertinent Vitals/Pain 6/10 in right knee.  Pt repositioned.      Mobility  Bed Mobility Bed Mobility: Supine to Sit Supine to Sit: 4: Min assist;HOB flat Details for Bed Mobility Assistance: Assist for right LE due to weakness.  Cues for sequence. Transfers Transfers: Sit to Stand;Stand to Sit Sit to Stand: 4: Min assist;With upper extremity assist;From bed Stand to Sit: 4: Min assist;With upper extremity assist;To chair/3-in-1 Details for Transfer Assistance: Assist for balance and due to right LE weakness.  Cues for sequence/hand/right LE placement. Ambulation/Gait Ambulation/Gait Assistance: 4: Min assist Ambulation Distance (Feet): 60 Feet Assistive device: Rolling walker Ambulation/Gait Assistance Details: Assist for balance due to slight right knee buckling.  Cues for tall posture and safe sequence. Gait Pattern: Step-to pattern;Decreased step length - right;Decreased stance time - right;Right flexed knee in stance;Trunk flexed Stairs: No Wheelchair Mobility Wheelchair Mobility: No    Exercises Total Joint Exercises Ankle  Circles/Pumps: AROM;Right;10 reps;Supine Quad Sets: AROM;Right;10 reps;Supine Heel Slides: AAROM;Right;10 reps;Supine   PT Diagnosis: Difficulty walking;Acute pain  PT Problem List: Decreased strength;Decreased range of motion;Decreased activity tolerance;Decreased balance;Decreased mobility;Decreased knowledge of use of DME;Decreased knowledge of precautions;Pain PT Treatment Interventions: DME instruction;Gait training;Functional mobility training;Therapeutic activities;Therapeutic exercise;Balance training;Patient/family education   PT Goals Acute Rehab PT Goals PT Goal Formulation: With patient Time For Goal Achievement: 06/22/12 Potential to Achieve Goals: Good Pt will go Supine/Side to Sit: with modified independence PT Goal: Supine/Side to Sit - Progress: Goal set today Pt will go Sit to Supine/Side: with modified independence PT Goal: Sit to Supine/Side - Progress: Goal set today Pt will go Sit to Stand: with modified independence PT Goal: Sit to Stand - Progress: Goal set today Pt will go Stand to Sit: with modified independence PT Goal: Stand to Sit - Progress: Goal set today Pt will Ambulate: >150 feet;with modified independence;with least restrictive assistive device PT Goal: Ambulate - Progress: Goal set today Pt will Perform Home Exercise Program: Independently PT Goal: Perform Home Exercise Program - Progress: Goal set today  Visit Information  Last PT Received On: 06/15/12 Assistance Needed: +1    Subjective Data  Subjective: "I didn't get a whole lot of sleep last night." Patient Stated Goal: Go home.   Prior Functioning  Home Living Lives With: Spouse;Other (Comment) (Grandson) Available Help at Discharge: Family Type of Home: House Home Access: Level entry Home Layout: One level Bathroom Shower/Tub: Health visitor: Standard Home Adaptive Equipment: Walker - rolling;Bedside commode/3-in-1 Prior Function Level of Independence:  Independent Able to Take Stairs?: Yes Driving: Yes Vocation: Full time employment Communication Communication: No difficulties    Cognition  Overall Cognitive Status:  Appears within functional limits for tasks assessed/performed Arousal/Alertness: Awake/alert Orientation Level: Appears intact for tasks assessed Behavior During Session: [facility] for tasks performed    Extremity/Trunk Assessment Right Upper Extremity Assessment RUE ROM/Strength/Tone: Within functional levels RUE Sensation: WFL - Light Touch RUE Coordination: WFL - gross/fine motor Left Upper Extremity Assessment LUE ROM/Strength/Tone: Within functional levels LUE Sensation: WFL - Light Touch LUE Coordination: WFL - gross/fine motor Right Lower Extremity Assessment RLE ROM/Strength/Tone: Deficits;Due to pain RLE ROM/Strength/Tone Deficits: 2/5 throughout.  AA/ROM 0-45 degrees. RLE Sensation: WFL - Light Touch RLE Coordination: WFL - gross motor Left Lower Extremity Assessment LLE ROM/Strength/Tone: Within functional levels LLE Sensation: WFL - Light Touch LLE Coordination: WFL - gross/fine motor Trunk Assessment Trunk Assessment: Normal   Balance Balance Balance Assessed: No  End of Session PT - End of Session Equipment Utilized During Treatment: Gait belt Activity Tolerance: Patient tolerated treatment well Patient left: in chair;with call bell/phone within reach Nurse Communication: Mobility status CPM Right Knee CPM Right Knee: Off  GP     Cephus Shelling 06/15/2012, 8:01 AM  06/15/2012 Cephus Shelling, PT, DPT [phone]

## 2012-06-15 NOTE — Progress Notes (Signed)
Physical Therapy Treatment Patient Details Name: Jessica Williams MRN: [id number] DOB: [birth year] Today's Date: 06/15/2012 Time: 4098-1191 PT Time Calculation (min): 18 min  PT Assessment / Plan / Recommendation Comments on Treatment Session  Pt admitted s/p right TKA and continues to progress with therapy.  Pt able to increase ambulation distance and independence with treatment.  Motivated!    Follow Up Recommendations  Home health PT    Barriers to Discharge None      Equipment Recommendations  None recommended by PT    Recommendations for Other Services    Frequency 7X/week   Plan Discharge plan remains appropriate;Frequency remains appropriate    Precautions / Restrictions Precautions Precautions: Knee Precaution Booklet Issued: No Restrictions Weight Bearing Restrictions: Yes RLE Weight Bearing: Weight bearing as tolerated   Pertinent Vitals/Pain 8/10 in right knee.  Pt repositioned and RN aware.    Mobility  Bed Mobility Bed Mobility: Supine to Sit Supine to Sit: 4: Min assist;HOB flat Details for Bed Mobility Assistance: Slight assist for right LE only due to weakness. Transfers Transfers: Sit to Stand;Stand to Sit Sit to Stand: 4: Min guard;With upper extremity assist;From bed Stand to Sit: 4: Min guard;With upper extremity assist;To chair/3-in-1 Details for Transfer Assistance: Guarding for balance with cues for safest hand/right LE placement. Ambulation/Gait Ambulation/Gait Assistance: 4: Min guard Ambulation Distance (Feet): 100 Feet Assistive device: Rolling walker Ambulation/Gait Assistance Details: Guarding for balance with cues for tall posture and continued extension at right knee. Gait Pattern: Step-to pattern;Decreased step length - right;Decreased stance time - right;Right flexed knee in stance Stairs: No Wheelchair Mobility Wheelchair Mobility: No    Exercises None   PT Diagnosis: Difficulty walking;Acute pain  PT Problem List: Decreased  strength;Decreased range of motion;Decreased activity tolerance;Decreased balance;Decreased mobility;Decreased knowledge of use of DME;Decreased knowledge of precautions;Pain PT Treatment Interventions: DME instruction;Gait training;Functional mobility training;Therapeutic activities;Therapeutic exercise;Balance training;Patient/family education   PT Goals Acute Rehab PT Goals PT Goal Formulation: With patient Time For Goal Achievement: 06/22/12 Potential to Achieve Goals: Good Pt will go Supine/Side to Sit: with modified independence PT Goal: Supine/Side to Sit - Progress: Progressing toward goal Pt will go Sit to Supine/Side: with modified independence PT Goal: Sit to Supine/Side - Progress: Goal set today Pt will go Sit to Stand: with modified independence PT Goal: Sit to Stand - Progress: Progressing toward goal Pt will go Stand to Sit: with modified independence PT Goal: Stand to Sit - Progress: Progressing toward goal Pt will Ambulate: >150 feet;with modified independence;with least restrictive assistive device PT Goal: Ambulate - Progress: Progressing toward goal Pt will Perform Home Exercise Program: Independently PT Goal: Perform Home Exercise Program - Progress: Goal set today  Visit Information  Last PT Received On: 06/15/12 Assistance Needed: +1    Subjective Data  Subjective: "I am doing well." Patient Stated Goal: Go home.   Cognition  Overall Cognitive Status: Appears within functional limits for tasks assessed/performed Arousal/Alertness: Awake/alert Orientation Level: Appears intact for tasks assessed Behavior During Session: [facility] for tasks performed    Balance  Balance Balance Assessed: No  End of Session PT - End of Session Equipment Utilized During Treatment: Gait belt Activity Tolerance: Patient tolerated treatment well Patient left: in chair;with call bell/phone within reach;with family/visitor present (with OT present.) Nurse Communication: Mobility  status CPM Right Knee CPM Right Knee: Off   GP     Cephus Shelling 06/15/2012, 10:56 AM  06/15/2012 Cephus Shelling, PT, DPT [phone]

## 2012-06-15 NOTE — Care Management Note (Signed)
    Page 1 of 1   06/15/2012     11:30:37 AM   CARE MANAGEMENT NOTE 06/15/2012  Patient:  Aken,Amiylah   Account Number:  [id number]  Date Initiated:  06/15/2012  Documentation initiated by:  Anette Guarneri  Subjective/Objective Assessment:   POD#1 s/p right TKA  plans to d/c home, has DME  [facility] services pre-arranged     Action/Plan:   Home with [facility] services   Anticipated DC Date:  06/18/2012   Anticipated DC Plan:  HOME W HOME HEALTH SERVICES      DC Planning Services  CM consult      Choice offered to / List presented to:             Status of service:   Medicare Important Message given?  NO (If response is "NO", the following Medicare IM given date fields will be blank) Date Medicare IM given:   Date Additional Medicare IM given:    Discharge Disposition:  HOME W HOME HEALTH SERVICES  Per UR Regulation:  Reviewed for med. necessity/level of care/duration of stay  If discussed at Long Length of Stay Meetings, dates discussed:    Comments:  06/15/12  11:26 Anette Guarneri RN/CM spoke with patient regarding d/c plans, patient plans to d/c home wiht Hh services, husband and family to assist. MD office pre-arranged for [facility] services with Genevieve Norlander has CPM/RW at home, patient not sure about [facility].

## 2012-06-15 NOTE — Progress Notes (Signed)
Occupational Therapy Evaluation Patient Details Name: Jessica Williams MRN: [id number] DOB: [birth year] Today's Date: 06/15/2012 Time: 1040-1059 OT Time Calculation (min): 19 min  OT Assessment / Plan / Recommendation Clinical Impression  56 y.o. pt. admitted s/p right TKA.  Pt. will benefit from OT in the acute care setting to maximize independence and safety in ADLs prior to d/c.     OT Assessment  Patient needs continued OT Services    Follow Up Recommendations  Home health OT    Barriers to Discharge      Equipment Recommendations  None recommended by OT    Recommendations for Other Services    Frequency  Min 2X/week    Precautions / Restrictions Precautions Precautions: Knee Precaution Booklet Issued: No Restrictions Weight Bearing Restrictions: Yes RLE Weight Bearing: Weight bearing as tolerated   Pertinent Vitals/Pain Pain in Rt. Knee. Nursing brought pain meds.     ADL  Lower Body Dressing: Performed;Supervision/safety Where Assessed - Lower Body Dressing: Supported sitting Toilet Transfer: Performed;Min guard Statistician Method: Sit to Barista: Regular height toilet;Grab bars Toileting - Clothing Manipulation and Hygiene: Performed;Minimal assistance (hygiene Mod I) Where Assessed - Toileting Clothing Manipulation and Hygiene: Standing Tub/Shower Transfer: Simulated;Min guard Tub/Shower Transfer Method: Ambulating Equipment Used: Gait belt;Rolling walker Transfers/Ambulation Related to ADLs: Min G for ambulation/transfers ADL Comments: Pt. donned/doffed socks while sitting in chair with Supervision. Pt. ambulated to bathroom with RW and performed toilet transfer with Min G. Pt. performed hygiene with Mod I and required Min A for clothing manipulation.    OT Diagnosis: Acute pain  OT Problem List: Decreased strength;Decreased range of motion;Decreased activity tolerance;Impaired balance (sitting and/or standing);Decreased knowledge of  use of DME or AE;Pain OT Treatment Interventions: Self-care/ADL training;DME and/or AE instruction;Therapeutic activities;Patient/family education;Balance training   OT Goals Acute Rehab OT Goals OT Goal Formulation: With patient Time For Goal Achievement: 06/29/12 Potential to Achieve Goals: Good ADL Goals Pt Will Perform Grooming: with modified independence;Standing at sink ADL Goal: Grooming - Progress: Goal set today Pt Will Perform Lower Body Bathing: with modified independence;Sit to stand from chair ADL Goal: Lower Body Bathing - Progress: Goal set today Pt Will Perform Lower Body Dressing: with modified independence;Sit to stand from chair;Sit to stand from bed ADL Goal: Lower Body Dressing - Progress: Goal set today Pt Will Transfer to Toilet: with modified independence;Ambulation;Regular height toilet ADL Goal: Toilet Transfer - Progress: Goal set today Pt Will Perform Toileting - Clothing Manipulation: with modified independence;Standing ADL Goal: Toileting - Clothing Manipulation - Progress: Goal set today Pt Will Perform Toileting - Hygiene: with modified independence;Sit to stand from 3-in-1/toilet ADL Goal: Toileting - Hygiene - Progress: Goal set today Pt Will Perform Tub/Shower Transfer: Shower transfer;with modified independence;Ambulation ADL Goal: Tub/Shower Transfer - Progress: Goal set today  Visit Information  Last OT Received On: 06/15/12 Assistance Needed: +1    Subjective Data  Subjective: Pt. pleasant in session.    Prior Functioning  Vision/Perception  Home Living Lives With: Spouse;Other (Comment) Available Help at Discharge: Family Type of Home: House Home Access: Level entry Home Layout: One level Bathroom Shower/Tub: Health visitor: Standard Home Adaptive Equipment: Walker - rolling;Bedside commode/3-in-1 Prior Function Level of Independence: Independent Able to Take Stairs?: Yes Driving: Yes Vocation: Full time  employment Communication Communication: No difficulties      Cognition  Overall Cognitive Status: Appears within functional limits for tasks assessed/performed Arousal/Alertness: Awake/alert Orientation Level: Appears intact for tasks assessed Behavior During Session: [facility] for  tasks performed    Extremity/Trunk Assessment Right Upper Extremity Assessment RUE ROM/Strength/Tone: Within functional levels RUE Sensation: WFL - Light Touch Left Upper Extremity Assessment LUE ROM/Strength/Tone: Within functional levels LUE Sensation: WFL - Light Touch   Mobility Bed Mobility Bed Mobility: Sit to Supine;Scooting to HOB Sit to Supine: 5: Supervision;HOB flat Scooting to HOB: 6: Modified independent (Device/Increase time) Transfers: Sit to Stand;Stand to Sit Sit to Stand: 4: Min guard;With upper extremity assist;From chair/3-in-1;From toilet Stand to Sit: 4: Min guard;With upper extremity assist;To bed;To toilet Details for Transfer Assistance: Min G for balance         End of Session OT - End of Session Activity Tolerance: Patient tolerated treatment well Patient left: in bed;with call bell/phone within reach;with family/visitor present  GO     Sabiha Sura, Mardella Layman 06/15/2012, 1:27 PM

## 2012-06-16 LAB — BASIC METABOLIC PANEL
CO2: 25 mEq/L (ref 19–32)
Calcium: 8.8 mg/dL (ref 8.4–10.5)
Creatinine, Ser: 0.49 mg/dL — ABNORMAL LOW (ref 0.50–1.10)
GFR calc non Af Amer: >90 mL/min (ref >90)

## 2012-06-16 LAB — CBC
MCH: 31.5 pg (ref 26.0–34.0)
MCHC: 34.4 g/dL (ref 30.0–36.0)
MCV: 91.6 fL (ref 78.0–100.0)
Platelets: 141 10*3/uL — ABNORMAL LOW (ref 150–400)
RBC: 3.33 MIL/uL — ABNORMAL LOW (ref 3.87–5.11)

## 2012-06-16 MED ORDER — ENOXAPARIN SODIUM 40 MG/0.4ML ~~LOC~~ SOLN: 40.0000 mg | Freq: Every day | SUBCUTANEOUS | Status: DC

## 2012-06-16 MED ORDER — METHOCARBAMOL 500 MG PO TABS: 500.0000 mg | ORAL_TABLET | Freq: Four times a day (QID) | ORAL | Status: AC | PRN

## 2012-06-16 MED ORDER — OXYCODONE HCL 5 MG PO TABS: 5.0000 mg | ORAL_TABLET | ORAL | Status: AC | PRN

## 2012-06-16 MED ORDER — OXYCODONE HCL 10 MG PO TB12: 10.0000 mg | ORAL_TABLET | Freq: Two times a day (BID) | ORAL | Status: DC

## 2012-06-16 NOTE — Progress Notes (Signed)
Physical Therapy Treatment Patient Details Name: Jessica Williams Neuroth MRN: [id number] DOB: [birth year] Today's Date: 06/16/2012 Time: 4098-1191 PT Time Calculation (min): 30 min  PT Assessment / Plan / Recommendation Comments on Treatment Session  Pt admitted s/p right TKA and continues to progress.  Pt tolerate increased ambulation distance with increased independence this am.  Pt ready for safe d/c home once medically cleared by MD.    Follow Up Recommendations  Home health PT    Barriers to Discharge        Equipment Recommendations  None recommended by PT    Recommendations for Other Services    Frequency 7X/week   Plan Discharge plan remains appropriate;Frequency remains appropriate    Precautions / Restrictions Precautions Precautions: Knee Precaution Booklet Issued: No Restrictions Weight Bearing Restrictions: Yes RLE Weight Bearing: Weight bearing as tolerated   Pertinent Vitals/Pain 6/10 in right knee.  Pt repositioned.    Mobility  Bed Mobility Bed Mobility: Supine to Sit Supine to Sit: 4: Min assist;HOB flat Details for Bed Mobility Assistance: Assist for right LE due to pain with cues for sequence. Transfers Transfers: Sit to Stand;Stand to Sit (2 trials.) Sit to Stand: 5: Supervision;With upper extremity assist;From bed;From toilet Stand to Sit: 5: Supervision;With upper extremity assist;To toilet;To chair/3-in-1 Details for Transfer Assistance: Verbal cues for safest hand/right LE placement. Ambulation/Gait Ambulation/Gait Assistance: 5: Supervision Ambulation Distance (Feet): 150 Feet Assistive device: Rolling walker Ambulation/Gait Assistance Details: Verbal cues for tall posture and attempted step-through sequence. Gait Pattern: Step-to pattern;Decreased step length - right;Decreased stance time - right;Trunk flexed Stairs: No Wheelchair Mobility Wheelchair Mobility: No    Exercises Total Joint Exercises Ankle Circles/Pumps: AROM;Right;10  reps;Supine Quad Sets: AROM;Right;10 reps;Supine Short Arc Quad: AAROM;Right;10 reps;Supine Heel Slides: AAROM;Right;10 reps;Supine Hip ABduction/ADduction: AAROM;Right;10 reps;Supine Straight Leg Raises: AAROM;Right;10 reps;Supine Goniometric ROM: AA/ROM right knee flexion 0-50 degrees.   PT Diagnosis:    PT Problem List:   PT Treatment Interventions:     PT Goals Acute Rehab PT Goals PT Goal Formulation: With patient Time For Goal Achievement: 06/22/12 Potential to Achieve Goals: Good PT Goal: Supine/Side to Sit - Progress: Progressing toward goal PT Goal: Sit to Stand - Progress: Progressing toward goal PT Goal: Stand to Sit - Progress: Progressing toward goal PT Goal: Ambulate - Progress: Progressing toward goal PT Goal: Perform Home Exercise Program - Progress: Progressing toward goal  Visit Information  Last PT Received On: 06/16/12 Assistance Needed: +1    Subjective Data  Subjective: "I am ready to go." Patient Stated Goal: Go home.   Cognition  Overall Cognitive Status: Appears within functional limits for tasks assessed/performed Arousal/Alertness: Awake/alert Orientation Level: Appears intact for tasks assessed Behavior During Session: [facility] for tasks performed    Balance  Balance Balance Assessed: No  End of Session PT - End of Session Equipment Utilized During Treatment: Gait belt Activity Tolerance: Patient tolerated treatment well Patient left: in bed;with call bell/phone within reach Nurse Communication: Mobility status   GP     Cephus Shelling 06/16/2012, 9:04 AM  06/16/2012 Cephus Shelling, PT, DPT [phone]

## 2012-06-16 NOTE — Progress Notes (Signed)
PT Note:   06/16/12 1031  PT Visit Information  Last PT Received On 06/16/12  Assistance Needed +1  PT Time Calculation  PT Start Time 1010  PT Stop Time 1030  PT Time Calculation (min) 20 min  Subjective Data  Subjective "It just is hurting."  Patient Stated Goal Go home.  Precautions  Precautions Knee  Precaution Booklet Issued No  Restrictions  Weight Bearing Restrictions Yes  RLE Weight Bearing WBAT  Cognition  Overall Cognitive Status Appears within functional limits for tasks assessed/performed  Arousal/Alertness Awake/alert  Orientation Level Appears intact for tasks assessed  Behavior During Session [facility] for tasks performed  Bed Mobility  Bed Mobility Sit to Supine  Sit to Supine 4: Min guard;HOB flat  Details for Bed Mobility Assistance Guarding for right LE due to pain with cues for sequence using sheet to assist right LE onto bed.  Transfers  Transfers Sit to Stand;Stand to [employer] Transfers  Sit to Stand 6: Modified independent (Device/Increase time);With upper extremity assist;From chair/3-in-1  Stand to Sit 6: Modified independent (Device/Increase time);With upper extremity assist;To bed  Stand Pivot Transfers 6: Modified independent (Device/Increase time)  Ambulation/Gait  Ambulation/Gait Assistance Not tested (comment)  Stairs No  Wheelchair Mobility  Wheelchair Mobility No  Balance  Balance Assessed No  Exercises  Exercises Total Joint  Total Joint Exercises  Ankle Circles/Pumps AROM;Right;10 reps;Supine  Quad Sets AROM;Right;10 reps;Supine  Heel Slides AAROM;Right;10 reps;Supine (Using sheet.)  Short Arc Quad AAROM;Right;10 reps;Supine  Hip ABduction/ADduction AAROM;Right;10 reps;Supine (Using sheet.)  Straight Leg Raises AAROM;Right;10 reps;Supine (Using sheet.)  PT - End of Session  Activity Tolerance Patient tolerated treatment well  Patient left in bed;with call bell/phone within reach;with family/visitor present  Nurse Communication  Mobility status  PT - Assessment/Plan  Comments on Treatment Session Pt admitted s/p right TKA and is ready for safe d/c home once medically cleared by MD.  Pt able to tolerate therapeutic exercises this pm and demonstrates increased independence with mobility.  PT Plan Discharge plan remains appropriate;Frequency remains appropriate  PT Frequency 7X/week  Follow Up Recommendations Home health PT  Equipment Recommended None recommended by PT  Acute Rehab PT Goals  PT Goal Formulation With patient  Time For Goal Achievement 06/22/12  Potential to Achieve Goals Good  PT Goal: Sit to Supine/Side - Progress Progressing toward goal  PT Goal: Sit to Stand - Progress Met  PT Goal: Stand to Sit - Progress Met  PT Goal: Perform Home Exercise Program - Progress Progressing toward goal  PT General Charges  $$ ACUTE PT VISIT 1 Procedure  PT Treatments  $Therapeutic Exercise 8-22 mins    Pain:  6/10 in right knee.  Pt repositioned.  06/16/2012 Cephus Shelling, PT, DPT [phone]

## 2012-06-16 NOTE — Progress Notes (Signed)
  Jessica Spurling, Jessica Williams   Jessica Cabal, Jessica Williams [address] [place], [place], [place]  [address]                             [phone]   PROGRESS NOTE  Subjective:  negative for Chest Pain  negative for Shortness of Breath  negative for Nausea/Vomiting   negative for Calf Pain  negative for Bowel Movement   Tolerating Diet: yes         Patient reports pain as 5 on 0-10 scale.    Objective: Vital signs in last 24 hours:   Patient Vitals for the past 24 hrs:  BP Temp Temp src Pulse Resp SpO2 Height Weight  06/16/12 0800 - - - - 18  93 % - -  06/16/12 0503 121/69 mmHg 97.5 F (36.4 C) - 98  18  93 % - -  06/15/12 2047 112/66 mmHg 98.8 F (37.1 C) Oral 95  18  92 % - -  06/15/12 2020 - - - - - - 5\' 6"  (1.676 m) 89.812 kg (198 lb)  06/15/12 1600 - - - - 20  98 % - -  06/15/12 1400 110/67 mmHg 98.7 F (37.1 C) - 93  18  96 % - -    @flow {1959:LAST@   Intake/Output from previous day:   07/30 0701 - 07/31 0700 In: 1110 [P.O.:1110] Out: 3 [Urine:3]   Intake/Output this shift:       Intake/Output      07/30 0701 - 07/31 0700 07/31 0701 - 08/01 0700   P.O. 1110    I.V. (mL/kg)     Total Intake(mL/kg) 1110 (12.4)    Urine (mL/kg/hr) 3 (0)    Drains     Blood     Total Output 3    Net +1107         Urine Occurrence 3 x       LABORATORY DATA:  Basename 06/16/12 0610 06/15/12 0545  WBC 9.2 11.5*  HGB 10.5* 10.4*  HCT 30.5* 30.3*  PLT 141* 151    Basename 06/16/12 0610 06/15/12 0545  NA 137 136  K 3.7 4.2  CL 99 100  CO2 25 24  BUN 8 10  CREATININE 0.49* 0.51  GLUCOSE 115* 123*  CALCIUM 8.8 8.4   Lab Results  Component Value Date   INR 1.07 06/08/2012    Examination:  General appearance: alert, cooperative and no distress Extremities: Homans sign is negative, no sign of DVT  Wound Exam: clean, dry, intact   Drainage:  None: wound tissue dry  Motor Exam: EHL and FHL Intact  Sensory Exam: Deep Peroneal normal  Vascular Exam:    Assessment:     2 Days Post-Op  Procedure(s) (LRB): TOTAL KNEE ARTHROPLASTY (Right)  ADDITIONAL DIAGNOSIS:  Active Problems:  * No active hospital problems. *   Acute Blood Loss Anemia   Plan: Physical Therapy as ordered Weight Bearing as Tolerated (WBAT)  DVT Prophylaxis:  Lovenox  DISCHARGE PLAN: Home  DISCHARGE NEEDS: HHPT, CPM, Walker and 3-in-1 comode seat         Jessica Williams 06/16/2012, 12:45 PM

## 2012-06-16 NOTE — Progress Notes (Signed)
OT Cancellation Note  Treatment cancelled today due to patient's refusal to participate: pt had just finished working with PT for the second time this am and became tearful when OT entered the room stating, "I'm just in a lot of pain right now. I can't do it right now." Pt pre-medicated and RN informed of pain. Will check back as schedule allows. Thank you. Jessica Williams, OTR/L Pager: [phone] 06/16/2012    [name] 06/16/2012, 11:35 AM

## 2012-06-16 NOTE — Discharge Summary (Signed)
Georgena Spurling, MD   [name], PA-C [address] [place], [place], [place]  [address]                             [phone]  PATIENT ID: Jessica Williams        MRN:  [id number]          DOB/AGE: 08/05/1956 / 56 y.o.    DISCHARGE SUMMARY  ADMISSION DATE:    06/14/2012 DISCHARGE DATE:   06/16/2012   ADMISSION DIAGNOSIS: osteoarthritis right knee    DISCHARGE DIAGNOSIS:  osteoarthritis right knee    ADDITIONAL DIAGNOSIS: Active Problems:  * No active hospital problems. *   Past Medical History  Diagnosis Date  . PONV (postoperative nausea and vomiting)   . Hypertension   . Hypercholesterolemia   . Arthritis   . GERD (gastroesophageal reflux disease)     PROCEDURE: Procedure(s): TOTAL KNEE ARTHROPLASTY on 06/14/2012  CONSULTS:     HISTORY:  See H&P in chart  HOSPITAL COURSE:  Maritza Hosterman is a 56 y.o. admitted on 06/14/2012 and found to have a diagnosis of osteoarthritis right knee.  After appropriate laboratory studies were obtained  they were taken to the operating room on 06/14/2012 and underwent Procedure(s): TOTAL KNEE ARTHROPLASTY.   They were given perioperative antibiotics:  Anti-infectives     Start     Dose/Rate Route Frequency Ordered Stop   06/14/12 1445   ceFAZolin (ANCEF) IVPB 1 g/50 mL premix        1 g 100 mL/hr over 30 Minutes Intravenous Every 6 hours 06/14/12 1336 06/14/12 2302   06/13/12 1433   ceFAZolin (ANCEF) IVPB 2 g/50 mL premix        2 g 100 mL/hr over 30 Minutes Intravenous 60 min pre-op 06/13/12 1433 06/14/12 0839        .  Tolerated the procedure well.  Placed with a foley intraoperatively.  Given Ofirmev at induction and for 48 hours.    POD #1, allowed out of bed to a chair.  PT for ambulation and exercise program.  Foley D/C'd in morning.  IV saline locked.  O2 discontionued.  POD #2, continued PT and ambulation.   Hemovac pulled. .  The remainder of the hospital course was dedicated to ambulation and strengthening.   The  patient was discharged on 2 Days Post-Op in  Good condition.  Blood products given:none  DIAGNOSTIC STUDIES: Recent vital signs: Patient Vitals for the past 24 hrs:  BP Temp Temp src Pulse Resp SpO2 Height Weight  06/16/12 0800 - - - - 18  93 % - -  06/16/12 0503 121/69 mmHg 97.5 F (36.4 C) - 98  18  93 % - -  2012/07/06 2047 112/66 mmHg 98.8 F (37.1 C) Oral 95  18  92 % - -  Jul 06, 2012 2020 - - - - - - 5\' 6"  (1.676 m) 89.812 kg (198 lb)  2012-07-06 1600 - - - - 20  98 % - -  07-06-2012 1400 110/67 mmHg 98.7 F (37.1 C) - 93  18  96 % - -       Recent laboratory studies:  [facility] 06/16/12 0610 2012-07-06 0545  WBC 9.2 11.5*  HGB 10.5* 10.4*  HCT 30.5* 30.3*  PLT 141* 151    Basename 06/16/12 0610 07-06-2012 0545  NA 137 136  K 3.7 4.2  CL 99 100  CO2 25 24  BUN 8  10  CREATININE 0.49* 0.51  GLUCOSE 115* 123*  CALCIUM 8.8 8.4   Lab Results  Component Value Date   INR 1.07 06/08/2012     Recent Radiographic Studies :  Dg Chest 2 View  06/08/2012  *RADIOLOGY REPORT*  Clinical Data: Hypertension, elevated cholesterol, arthritis, preoperative evaluation for a total knee replacement  CHEST - 2 VIEW  Comparison: 10/15/2005  Findings: Normal heart size and vascularity.  Negative for pneumonia, collapse, consolidation, effusion or pneumothorax. Trachea midline.  Degenerative changes of the spine.  IMPRESSION: No acute chest finding.  Stable exam  Original Report Authenticated By: Judie Petit. Ruel Favors, M.D.    DISCHARGE INSTRUCTIONS: Discharge Orders    Future Orders Please Complete By Expires   Diet - low sodium heart healthy      Call MD / Call 911      Comments:   If you experience chest pain or shortness of breath, CALL 911 and be transported to the hospital emergency room.  If you develope a fever above 101 F, pus (white drainage) or increased drainage or redness at the wound, or calf pain, call your surgeon's office.   Constipation Prevention      Comments:   Drink plenty of  fluids.  Prune juice may be helpful.  You may use a stool softener, such as Colace (over the counter) 100 mg twice a day.  Use MiraLax (over the counter) for constipation as needed.   Increase activity slowly as tolerated      Driving restrictions      Comments:   No driving for 6 weeks   Lifting restrictions      Comments:   No lifting for 6 weeks   CPM      Comments:   Continuous passive motion machine (CPM):      Use the CPM from 0 to 90 for 6-8 hours per day.      You may increase by 10 per day.  You may break it up into 2 or 3 sessions per day.      Use CPM for 2 weeks or until you are told to stop.   TED hose      Comments:   Use stockings (TED hose) for 3 weeks on both leg(s).  You may remove them at night for sleeping.   Change dressing      Comments:   Change dressing on thursday, then change the dressing daily with sterile 4 x 4 inch gauze dressing and apply TED hose.  You may clean the incision with alcohol prior to redressing.   Do not put a pillow under the knee. Place it under the heel.         DISCHARGE MEDICATIONS:   Medication List  As of 06/16/2012 12:53 PM   STOP taking these medications         ibuprofen 200 MG tablet         TAKE these medications         atenolol 50 MG tablet   Commonly known as: TENORMIN   Take 50 mg by mouth daily.      atorvastatin 40 MG tablet   Commonly known as: LIPITOR   Take 40 mg by mouth daily.      Calcium 600-200 MG-UNIT per tablet   Take 1 tablet by mouth daily.      diclofenac 75 MG EC tablet   Commonly known as: VOLTAREN   Take 75 mg by mouth daily as needed. Last dose  on 06/04/12 in prep for surgery      enoxaparin 40 MG/0.4ML injection   Commonly known as: LOVENOX   Inject 0.4 mLs (40 mg total) into the skin daily.      lisinopril 10 MG tablet   Commonly known as: PRINIVIL,ZESTRIL   Take 10 mg by mouth daily.      methocarbamol 500 MG tablet   Commonly known as: ROBAXIN   Take 1-2 tablets (500-1,000 mg  total) by mouth every 6 (six) hours as needed.      oxyCODONE 5 MG immediate release tablet   Commonly known as: Oxy IR/ROXICODONE   Take 1-2 tablets (5-10 mg total) by mouth every 3 (three) hours as needed.      oxyCODONE 10 MG 12 hr tablet   Commonly known as: OXYCONTIN   Take 1 tablet (10 mg total) by mouth every 12 (twelve) hours.            FOLLOW UP VISIT:   Follow-up Information    Follow up with Raymon Mutton, MD. Call on 06/29/2012.   Contact information:   [address] [place] [address] [phone]          DISPOSITION:  Home    CONDITION:  Good   Caitlan Chauca 06/16/2012, 12:53 PM

## 2013-05-17 ENCOUNTER — Other Ambulatory Visit: Payer: Self-pay | Admitting: Neurosurgery

## 2013-05-27 ENCOUNTER — Encounter (HOSPITAL_COMMUNITY): Payer: Self-pay | Admitting: Pharmacy Technician

## 2013-06-06 ENCOUNTER — Encounter (HOSPITAL_COMMUNITY)
Admission: RE | Admit: 2013-06-06 | Discharge: 2013-06-06 | Disposition: A | Discharge status: Home / Self Care | Payer: Managed Care, Other (non HMO) | Source: Ambulatory Visit | Attending: Neurosurgery | Admitting: Neurosurgery

## 2013-06-06 ENCOUNTER — Ambulatory Visit (HOSPITAL_COMMUNITY)
Admission: RE | Admit: 2013-06-06 | Discharge: 2013-06-06 | Disposition: A | Discharge status: Home / Self Care | Payer: Managed Care, Other (non HMO) | Source: Ambulatory Visit | Attending: Anesthesiology | Admitting: Anesthesiology

## 2013-06-06 ENCOUNTER — Encounter (HOSPITAL_COMMUNITY): Payer: Self-pay

## 2013-06-06 DIAGNOSIS — Z01818 Encounter for other preprocedural examination: Secondary | ICD-10-CM | POA: Insufficient documentation

## 2013-06-06 DIAGNOSIS — Z01812 Encounter for preprocedural laboratory examination: Secondary | ICD-10-CM | POA: Insufficient documentation

## 2013-06-06 LAB — CBC WITH DIFFERENTIAL/PLATELET
Basophils Absolute: 0.1 10*3/uL (ref 0.0–0.1)
Basophils Relative: 1 % (ref 0–1)
Eosinophils Absolute: 0.3 10*3/uL (ref 0.0–0.7)
Eosinophils Relative: 3 % (ref 0–5)
HCT: 42.1 % (ref 36.0–46.0)
MCH: 30.6 pg (ref 26.0–34.0)
MCHC: 34.7 g/dL (ref 30.0–36.0)
Monocytes Absolute: 0.9 10*3/uL (ref 0.1–1.0)
Neutro Abs: 5.1 10*3/uL (ref 1.7–7.7)
RDW: 13.4 % (ref 11.5–15.5)

## 2013-06-06 LAB — TYPE AND SCREEN: Antibody Screen: NEGATIVE

## 2013-06-06 LAB — SURGICAL PCR SCREEN
MRSA, PCR: NEGATIVE
Staphylococcus aureus: NEGATIVE

## 2013-06-06 LAB — URINE MICROSCOPIC-ADD ON

## 2013-06-06 LAB — URINALYSIS, ROUTINE W REFLEX MICROSCOPIC
Bilirubin Urine: NEGATIVE
Hgb urine dipstick: NEGATIVE
Ketones, ur: NEGATIVE mg/dL
Specific Gravity, Urine: 1.01 (ref 1.005–1.030)
Urobilinogen, UA: 0.2 mg/dL (ref 0.0–1.0)

## 2013-06-06 NOTE — Progress Notes (Signed)
Have requested any cardiac tests done in July by Dr. Vanetta Shawl office in [place] [phone]). And have requested EKG from Dr. Quay Burow office [phone]) .Marland KitchenMarland KitchenDA

## 2013-06-06 NOTE — Pre-Procedure Instructions (Signed)
Jules Vidovich  06/06/2013   Your procedure is scheduled on:  Thursday, July 24th   Report to [facility] at 9:30 AM.             (Arrival time is Per your surgeon's request)  Call this number if you have problems the morning of surgery: [phone]   Remember:   Do not eat food or drink liquids after midnight Wednesday.   Take these medicines the morning of surgery with A SIP OF WATER: Atenolol   Do not wear jewelry, make-up or nail polish.  Do not wear lotions, powders, or perfumes. You may NOT wear deodorant.  Do not shave underarms & legs 48 hours prior to surgery.    Do not bring valuables to the hospital.  [facility] is not responsible for any belongings or valuables.  Contacts, dentures or bridgework may not be worn into surgery.  Leave suitcase in the car. After surgery it may be brought to your room.  For patients admitted to the hospital, checkout time is 11:00 AM the day of discharge.   Name and phone number of your driver:    Special Instructions: Shower using CHG 2 nights before surgery and the night before surgery.  If you shower the day of surgery use CHG.  Use special wash - you have one bottle of CHG for all showers.  You should use approximately 1/3 of the bottle for each shower.   Please read over the following fact sheets that you were given: Pain Booklet, Blood Transfusion Information, MRSA Information and Surgical Site Infection Prevention

## 2013-06-08 MED ORDER — CEFAZOLIN SODIUM-DEXTROSE 2-3 GM-% IV SOLR
2.0000 g | INTRAVENOUS | Status: AC
  Administered 2013-06-09: 3 g via INTRAVENOUS
  Filled 2013-06-08: qty 50

## 2013-06-09 ENCOUNTER — Inpatient Hospital Stay (HOSPITAL_COMMUNITY): Payer: Managed Care, Other (non HMO) | Admitting: Anesthesiology

## 2013-06-09 ENCOUNTER — Inpatient Hospital Stay (HOSPITAL_COMMUNITY): Payer: Managed Care, Other (non HMO)

## 2013-06-09 ENCOUNTER — Inpatient Hospital Stay (HOSPITAL_COMMUNITY)
Admission: RE | Admit: 2013-06-09 | Discharge: 2013-06-11 | DRG: 460 | Disposition: A | Discharge status: Home Health | Payer: Managed Care, Other (non HMO) | Source: Ambulatory Visit | Attending: Neurosurgery | Admitting: Neurosurgery

## 2013-06-09 ENCOUNTER — Encounter (HOSPITAL_COMMUNITY): Payer: Self-pay | Admitting: Surgery

## 2013-06-09 ENCOUNTER — Encounter (HOSPITAL_COMMUNITY)
Admission: RE | Disposition: A | Discharge status: Home Health | Payer: Self-pay | Source: Ambulatory Visit | Attending: Neurosurgery

## 2013-06-09 ENCOUNTER — Encounter (HOSPITAL_COMMUNITY): Payer: Self-pay | Admitting: Anesthesiology

## 2013-06-09 DIAGNOSIS — Z79899 Other long term (current) drug therapy: Secondary | ICD-10-CM

## 2013-06-09 DIAGNOSIS — Q762 Congenital spondylolisthesis: Principal | ICD-10-CM

## 2013-06-09 DIAGNOSIS — M47817 Spondylosis without myelopathy or radiculopathy, lumbosacral region: Secondary | ICD-10-CM | POA: Diagnosis present

## 2013-06-09 DIAGNOSIS — I10 Essential (primary) hypertension: Secondary | ICD-10-CM | POA: Diagnosis present

## 2013-06-09 DIAGNOSIS — K219 Gastro-esophageal reflux disease without esophagitis: Secondary | ICD-10-CM | POA: Diagnosis present

## 2013-06-09 HISTORY — PX: POSTERIOR LUMBAR FUSION: SHX6036

## 2013-06-09 HISTORY — DX: Other chronic pain: G89.29

## 2013-06-09 HISTORY — DX: Low back pain: M54.5

## 2013-06-09 LAB — PROTIME-INR
INR: 1.03 (ref 0.00–1.49)
Prothrombin Time: 13.3 seconds (ref 11.6–15.2)

## 2013-06-09 LAB — BASIC METABOLIC PANEL
CO2: 25 mEq/L (ref 19–32)
Chloride: 102 mEq/L (ref 96–112)
Creatinine, Ser: 0.5 mg/dL (ref 0.50–1.10)
Glucose, Bld: 120 mg/dL — ABNORMAL HIGH (ref 70–99)
Sodium: 140 mEq/L (ref 135–145)

## 2013-06-09 SURGERY — POSTERIOR LUMBAR FUSION 1 LEVEL
Anesthesia: General | Site: Back | Wound class: Clean

## 2013-06-09 MED ORDER — PROMETHAZINE HCL 25 MG PO TABS: 12.5000 mg | ORAL_TABLET | ORAL | Status: DC | PRN

## 2013-06-09 MED ORDER — OXYCODONE HCL 5 MG PO TABS: 5.0000 mg | ORAL_TABLET | Freq: Once | ORAL | Status: AC | PRN

## 2013-06-09 MED ORDER — DOCUSATE SODIUM 100 MG PO CAPS
100.0000 mg | ORAL_CAPSULE | Freq: Two times a day (BID) | ORAL | Status: DC
  Administered 2013-06-09 – 2013-06-11 (×4): 100 mg via ORAL
  Filled 2013-06-09 (×4): qty 1

## 2013-06-09 MED ORDER — CYCLOBENZAPRINE HCL 10 MG PO TABS
ORAL_TABLET | ORAL | Status: AC
  Administered 2013-06-09: 10 mg via ORAL
  Filled 2013-06-09: qty 1

## 2013-06-09 MED ORDER — LIDOCAINE HCL (CARDIAC) 20 MG/ML IV SOLN
INTRAVENOUS | Status: DC | PRN
  Administered 2013-06-09: 80 mg via INTRAVENOUS

## 2013-06-09 MED ORDER — BACITRACIN 50000 UNITS IM SOLR
INTRAMUSCULAR | Status: AC
  Filled 2013-06-09: qty 1

## 2013-06-09 MED ORDER — CITALOPRAM HYDROBROMIDE 10 MG PO TABS
10.0000 mg | ORAL_TABLET | Freq: Every day | ORAL | Status: DC
  Administered 2013-06-09 – 2013-06-11 (×3): 10 mg via ORAL
  Filled 2013-06-09 (×3): qty 1

## 2013-06-09 MED ORDER — METHOCARBAMOL 500 MG PO TABS: 500.0000 mg | ORAL_TABLET | Freq: Four times a day (QID) | ORAL | Status: DC | PRN

## 2013-06-09 MED ORDER — ACETAMINOPHEN 650 MG RE SUPP: 650.0000 mg | RECTAL | Status: DC | PRN

## 2013-06-09 MED ORDER — DEXAMETHASONE SODIUM PHOSPHATE 4 MG/ML IJ SOLN
INTRAMUSCULAR | Status: DC | PRN
  Administered 2013-06-09: 4 mg via INTRAVENOUS

## 2013-06-09 MED ORDER — HYDROMORPHONE HCL PF 1 MG/ML IJ SOLN
0.2500 mg | INTRAMUSCULAR | Status: DC | PRN
  Administered 2013-06-09 (×2): 0.5 mg via INTRAVENOUS

## 2013-06-09 MED ORDER — HYDROMORPHONE HCL PF 1 MG/ML IJ SOLN
INTRAMUSCULAR | Status: AC
  Administered 2013-06-09: 0.5 mg via INTRAVENOUS
  Filled 2013-06-09: qty 1

## 2013-06-09 MED ORDER — ACETAMINOPHEN 325 MG PO TABS: 650.0000 mg | ORAL_TABLET | ORAL | Status: DC | PRN

## 2013-06-09 MED ORDER — PROPOFOL 10 MG/ML IV BOLUS
INTRAVENOUS | Status: DC | PRN
  Administered 2013-06-09: 160 mg via INTRAVENOUS

## 2013-06-09 MED ORDER — MORPHINE SULFATE (PF) 1 MG/ML IV SOLN
INTRAVENOUS | Status: DC
  Administered 2013-06-09: 0.1 mg via INTRAVENOUS
  Administered 2013-06-10: 8 mg via INTRAVENOUS

## 2013-06-09 MED ORDER — PROMETHAZINE HCL 25 MG/ML IJ SOLN: 6.2500 mg | INTRAMUSCULAR | Status: DC | PRN

## 2013-06-09 MED ORDER — DIPHENHYDRAMINE HCL 50 MG/ML IJ SOLN: 12.5000 mg | Freq: Four times a day (QID) | INTRAMUSCULAR | Status: DC | PRN

## 2013-06-09 MED ORDER — NEOSTIGMINE METHYLSULFATE 1 MG/ML IJ SOLN
INTRAMUSCULAR | Status: DC | PRN
  Administered 2013-06-09: 3 mg via INTRAVENOUS

## 2013-06-09 MED ORDER — NALOXONE HCL 0.4 MG/ML IJ SOLN: 0.4000 mg | INTRAMUSCULAR | Status: DC | PRN

## 2013-06-09 MED ORDER — SODIUM CHLORIDE 0.9 % IR SOLN
Status: DC | PRN
  Administered 2013-06-09: 12:00:00

## 2013-06-09 MED ORDER — LACTATED RINGERS IV SOLN
INTRAVENOUS | Status: DC
  Administered 2013-06-09 – 2013-06-10 (×2): via INTRAVENOUS

## 2013-06-09 MED ORDER — ONDANSETRON HCL 4 MG/2ML IJ SOLN: 4.0000 mg | Freq: Four times a day (QID) | INTRAMUSCULAR | Status: DC | PRN

## 2013-06-09 MED ORDER — SODIUM CHLORIDE 0.9 % IJ SOLN: 9.0000 mL | INTRAMUSCULAR | Status: DC | PRN

## 2013-06-09 MED ORDER — SODIUM CHLORIDE 0.9 % IV SOLN
INTRAVENOUS | Status: AC
  Filled 2013-06-09: qty 500

## 2013-06-09 MED ORDER — CEFAZOLIN SODIUM 1-5 GM-% IV SOLN
1.0000 g | Freq: Three times a day (TID) | INTRAVENOUS | Status: AC
  Administered 2013-06-09 – 2013-06-10 (×2): 1 g via INTRAVENOUS
  Filled 2013-06-09 (×2): qty 50

## 2013-06-09 MED ORDER — CEFAZOLIN SODIUM 1-5 GM-% IV SOLN
INTRAVENOUS | Status: AC
  Filled 2013-06-09: qty 50

## 2013-06-09 MED ORDER — DIPHENHYDRAMINE HCL 12.5 MG/5ML PO ELIX: 12.5000 mg | ORAL_SOLUTION | Freq: Four times a day (QID) | ORAL | Status: DC | PRN

## 2013-06-09 MED ORDER — LACTATED RINGERS IV SOLN
INTRAVENOUS | Status: DC
  Administered 2013-06-09 (×2): via INTRAVENOUS

## 2013-06-09 MED ORDER — MIDAZOLAM HCL 5 MG/5ML IJ SOLN
INTRAMUSCULAR | Status: DC | PRN
  Administered 2013-06-09: 2 mg via INTRAVENOUS

## 2013-06-09 MED ORDER — LISINOPRIL 10 MG PO TABS
10.0000 mg | ORAL_TABLET | Freq: Every day | ORAL | Status: DC
  Administered 2013-06-09 – 2013-06-11 (×3): 10 mg via ORAL
  Filled 2013-06-09 (×3): qty 1

## 2013-06-09 MED ORDER — ATORVASTATIN CALCIUM 40 MG PO TABS
40.0000 mg | ORAL_TABLET | Freq: Every day | ORAL | Status: DC
  Administered 2013-06-09 – 2013-06-11 (×3): 40 mg via ORAL
  Filled 2013-06-09 (×3): qty 1

## 2013-06-09 MED ORDER — ARTIFICIAL TEARS OP OINT
TOPICAL_OINTMENT | OPHTHALMIC | Status: DC | PRN
  Administered 2013-06-09: 1 via OPHTHALMIC

## 2013-06-09 MED ORDER — OXYCODONE HCL 5 MG PO TABS
ORAL_TABLET | ORAL | Status: AC
  Administered 2013-06-09: 5 mg via ORAL
  Filled 2013-06-09: qty 1

## 2013-06-09 MED ORDER — CYCLOBENZAPRINE HCL 10 MG PO TABS: 10.0000 mg | ORAL_TABLET | Freq: Three times a day (TID) | ORAL | Status: DC | PRN

## 2013-06-09 MED ORDER — MORPHINE SULFATE (PF) 1 MG/ML IV SOLN
INTRAVENOUS | Status: AC
  Filled 2013-06-09: qty 25

## 2013-06-09 MED ORDER — BISACODYL 10 MG RE SUPP: 10.0000 mg | Freq: Every day | RECTAL | Status: DC | PRN

## 2013-06-09 MED ORDER — MAGNESIUM HYDROXIDE 400 MG/5ML PO SUSP: 30.0000 mL | Freq: Every day | ORAL | Status: DC | PRN

## 2013-06-09 MED ORDER — SODIUM CHLORIDE 0.9 % IJ SOLN
3.0000 mL | Freq: Two times a day (BID) | INTRAMUSCULAR | Status: DC
  Administered 2013-06-09 – 2013-06-11 (×4): 3 mL via INTRAVENOUS

## 2013-06-09 MED ORDER — ATENOLOL 50 MG PO TABS
50.0000 mg | ORAL_TABLET | Freq: Every day | ORAL | Status: DC
  Administered 2013-06-10 – 2013-06-11 (×2): 50 mg via ORAL
  Filled 2013-06-09 (×3): qty 1

## 2013-06-09 MED ORDER — SODIUM CHLORIDE 0.9 % IV SOLN: 250.0000 mL | INTRAVENOUS | Status: DC

## 2013-06-09 MED ORDER — ONDANSETRON HCL 4 MG/2ML IJ SOLN: 4.0000 mg | INTRAMUSCULAR | Status: DC | PRN

## 2013-06-09 MED ORDER — VECURONIUM BROMIDE 10 MG IV SOLR
INTRAVENOUS | Status: DC | PRN
  Administered 2013-06-09: 2 mg via INTRAVENOUS

## 2013-06-09 MED ORDER — METHOCARBAMOL 100 MG/ML IJ SOLN
500.0000 mg | Freq: Four times a day (QID) | INTRAVENOUS | Status: DC | PRN
  Filled 2013-06-09: qty 5

## 2013-06-09 MED ORDER — PROMETHAZINE HCL 25 MG/ML IJ SOLN
12.5000 mg | INTRAMUSCULAR | Status: DC | PRN
  Filled 2013-06-09: qty 1

## 2013-06-09 MED ORDER — GLYCOPYRROLATE 0.2 MG/ML IJ SOLN
INTRAMUSCULAR | Status: DC | PRN
  Administered 2013-06-09: 0.4 mg via INTRAVENOUS

## 2013-06-09 MED ORDER — THROMBIN 20000 UNITS EX SOLR
CUTANEOUS | Status: DC | PRN
  Administered 2013-06-09: 12:00:00 via TOPICAL

## 2013-06-09 MED ORDER — 0.9 % SODIUM CHLORIDE (POUR BTL) OPTIME
TOPICAL | Status: DC | PRN
  Administered 2013-06-09: 1000 mL

## 2013-06-09 MED ORDER — SODIUM CHLORIDE 0.9 % IJ SOLN: 3.0000 mL | INTRAMUSCULAR | Status: DC | PRN

## 2013-06-09 MED ORDER — ZOLPIDEM TARTRATE 5 MG PO TABS: 5.0000 mg | ORAL_TABLET | Freq: Every evening | ORAL | Status: DC | PRN

## 2013-06-09 MED ORDER — ROCURONIUM BROMIDE 100 MG/10ML IV SOLN
INTRAVENOUS | Status: DC | PRN
  Administered 2013-06-09: 50 mg via INTRAVENOUS

## 2013-06-09 MED ORDER — KETOROLAC TROMETHAMINE 30 MG/ML IJ SOLN
30.0000 mg | Freq: Four times a day (QID) | INTRAMUSCULAR | Status: AC
  Administered 2013-06-09 – 2013-06-10 (×3): 30 mg via INTRAVENOUS
  Filled 2013-06-09 (×3): qty 1

## 2013-06-09 MED ORDER — LIDOCAINE-EPINEPHRINE 1 %-1:100000 IJ SOLN
INTRAMUSCULAR | Status: DC | PRN
  Administered 2013-06-09: 20 mL

## 2013-06-09 MED ORDER — KETOROLAC TROMETHAMINE 30 MG/ML IJ SOLN
INTRAMUSCULAR | Status: AC
  Administered 2013-06-09: 30 mg via INTRAVENOUS
  Filled 2013-06-09: qty 1

## 2013-06-09 MED ORDER — FENTANYL CITRATE 0.05 MG/ML IJ SOLN
INTRAMUSCULAR | Status: DC | PRN
  Administered 2013-06-09 (×5): 50 ug via INTRAVENOUS

## 2013-06-09 MED ORDER — OXYCODONE HCL 5 MG/5ML PO SOLN: 5.0000 mg | Freq: Once | ORAL | Status: AC | PRN

## 2013-06-09 SURGICAL SUPPLY — 62 items
BAG DECANTER FOR FLEXI CONT (MISCELLANEOUS) ×2 IMPLANT
BENZOIN TINCTURE PRP APPL 2/3 (GAUZE/BANDAGES/DRESSINGS) ×2 IMPLANT
BLADE SURG ROTATE 9660 (MISCELLANEOUS) IMPLANT
BUR PRECISION FLUTE 5.0 (BURR) ×2 IMPLANT
CAGE CONCORDE BULLET 9X10X23 (Cage) ×4 IMPLANT
CANISTER SUCTION 2500CC (MISCELLANEOUS) ×2 IMPLANT
CLOTH BEACON ORANGE TIMEOUT ST (SAFETY) ×2 IMPLANT
CONT SPEC 4OZ CLIKSEAL STRL BL (MISCELLANEOUS) ×4 IMPLANT
COVER BACK TABLE 24X17X13 BIG (DRAPES) IMPLANT
COVER TABLE BACK 60X90 (DRAPES) ×2 IMPLANT
DECANTER SPIKE VIAL GLASS SM (MISCELLANEOUS) ×2 IMPLANT
DRAPE C-ARM 42X72 X-RAY (DRAPES) ×4 IMPLANT
DRAPE LAPAROTOMY 100X72X124 (DRAPES) ×2 IMPLANT
DRAPE POUCH INSTRU U-SHP 10X18 (DRAPES) ×2 IMPLANT
DRAPE PROXIMA HALF (DRAPES) ×2 IMPLANT
DRESSING TELFA 8X3 (GAUZE/BANDAGES/DRESSINGS) ×2 IMPLANT
DURAPREP 26ML APPLICATOR (WOUND CARE) ×2 IMPLANT
ELECT REM PT RETURN 9FT ADLT (ELECTROSURGICAL) ×2
ELECTRODE REM PT RTRN 9FT ADLT (ELECTROSURGICAL) ×1 IMPLANT
GAUZE SPONGE 4X4 16PLY XRAY LF (GAUZE/BANDAGES/DRESSINGS) IMPLANT
GLOVE BIO SURGEON STRL SZ8.5 (GLOVE) ×2 IMPLANT
GLOVE BIOGEL PI IND STRL 7.0 (GLOVE) ×3 IMPLANT
GLOVE BIOGEL PI INDICATOR 7.0 (GLOVE) ×3
GLOVE ECLIPSE 7.5 STRL STRAW (GLOVE) ×4 IMPLANT
GLOVE EXAM NITRILE LRG STRL (GLOVE) IMPLANT
GLOVE EXAM NITRILE MD LF STRL (GLOVE) IMPLANT
GLOVE EXAM NITRILE XL STR (GLOVE) IMPLANT
GLOVE EXAM NITRILE XS STR PU (GLOVE) IMPLANT
GLOVE SS BIOGEL STRL SZ 8 (GLOVE) ×1 IMPLANT
GLOVE SUPERSENSE BIOGEL SZ 8 (GLOVE) ×1
GLOVE SURG SS PI 7.0 STRL IVOR (GLOVE) ×6 IMPLANT
GOWN BRE IMP SLV AUR LG STRL (GOWN DISPOSABLE) ×2 IMPLANT
GOWN BRE IMP SLV AUR XL STRL (GOWN DISPOSABLE) ×8 IMPLANT
GOWN STRL REIN 2XL LVL4 (GOWN DISPOSABLE) IMPLANT
KIT BASIN OR (CUSTOM PROCEDURE TRAY) ×2 IMPLANT
KIT ROOM TURNOVER OR (KITS) ×2 IMPLANT
NEEDLE HYPO 22GX1.5 SAFETY (NEEDLE) ×4 IMPLANT
NS IRRIG 1000ML POUR BTL (IV SOLUTION) ×2 IMPLANT
PACK LAMINECTOMY NEURO (CUSTOM PROCEDURE TRAY) ×2 IMPLANT
PAD ARMBOARD 7.5X6 YLW CONV (MISCELLANEOUS) ×6 IMPLANT
PATTIES SURGICAL .75X.75 (GAUZE/BANDAGES/DRESSINGS) ×2 IMPLANT
ROD PRE BENT EXPEDIUM 35MM (Rod) ×4 IMPLANT
SCREW EXPEDIUM POLYAXIAL 6X45M (Screw) ×4 IMPLANT
SCREW EXPEDIUM POLYAXIAL 6X50M (Screw) ×4 IMPLANT
SCREW SET SINGLE INNER (Screw) ×8 IMPLANT
SPONGE GAUZE 4X4 12PLY (GAUZE/BANDAGES/DRESSINGS) ×2 IMPLANT
SPONGE LAP 4X18 X RAY DECT (DISPOSABLE) IMPLANT
SPONGE SURGIFOAM ABS GEL 100 (HEMOSTASIS) ×2 IMPLANT
STRIP CLOSURE SKIN 1/2X4 (GAUZE/BANDAGES/DRESSINGS) ×2 IMPLANT
STRIP NEXOSS 5CC (Neuro Prosthesis/Implant) ×2 IMPLANT
SUT VIC AB 0 CT1 18XCR BRD8 (SUTURE) ×3 IMPLANT
SUT VIC AB 0 CT1 8-18 (SUTURE) ×3
SUT VIC AB 2-0 CP2 18 (SUTURE) ×4 IMPLANT
SUT VIC AB 3-0 SH 8-18 (SUTURE) ×4 IMPLANT
SYR 20ML ECCENTRIC (SYRINGE) ×2 IMPLANT
SYR CONTROL 10ML LL (SYRINGE) ×2 IMPLANT
TAPE CLOTH SURG 4X10 WHT LF (GAUZE/BANDAGES/DRESSINGS) ×2 IMPLANT
TOWEL OR 17X24 6PK STRL BLUE (TOWEL DISPOSABLE) ×2 IMPLANT
TOWEL OR 17X26 10 PK STRL BLUE (TOWEL DISPOSABLE) ×2 IMPLANT
TRAP SPECIMEN MUCOUS 40CC (MISCELLANEOUS) IMPLANT
TRAY FOLEY CATH 14FRSI W/METER (CATHETERS) ×2 IMPLANT
WATER STERILE IRR 1000ML POUR (IV SOLUTION) ×2 IMPLANT

## 2013-06-09 NOTE — Preoperative (Signed)
Beta Blockers   Reason not to administer Beta Blockers:atenolol 06/09/13

## 2013-06-09 NOTE — Op Note (Signed)
06/09/2013  1:27 PM  PATIENT:  Jessica Williams  57 y.o. female  PRE-OPERATIVE DIAGNOSIS:  Spondylolisthesis, Lumbar stenosis, Lumbar spondylosis, Lumbar radiculopathy, L4-5  POST-OPERATIVE DIAGNOSIS:  Spondylolisthesis, Lumbar stenosis, Lumbar spondylosis, Lumbar radiculopathy, L4-5  PROCEDURE:  Procedure(s): Decompressive laminectomy decompressing L4 and L5 roots (2 levels) ,  PLIF L4-5,  interbody cages L4-5 , nonsegmented pedicle screw fixation L4-5 with expedium screws L4-5 ,  posterlateral fusion L4-5 , autograft , allograft , bone marrow aspirate   SURGEON:  Surgeon(s): Clydene Fake, MD Cristi Loron, MD-assist    ANESTHESIA:   general  EBL:  Total I/O In: 1300 [I.V.:1300] Out: 480 [Urine:330; Blood:150]  BLOOD ADMINISTERED:none  DRAINS: none   SPECIMEN:  No Specimen  DICTATION: Patient back and leg pain trouble walking at worst left lactic relief from injections MRI and x-rays lumbar spine shows anterolisthesis of 4 and 5 and foraminal stenosis and lateral recess stenosis and facets are pulled apart with instability of flexion-extension x-rays. After discussion was decided to proceed with decompression fusion.  Patient brought into operating room general anesthesia induced patient placed in prone position Wilson frame all pressure points padded. Patient prepped draped sterile fashion 7 incision injected 20 cc 1% lidocaine with epinephrine. Incision was made in the lower lumbar spine incision taken of the fascia hemostasis obtained with position fascia incised on the left side so we can see her Vitoss process markers were placed and x-rays obtained showing we were at the L3-4 level so we exposed 1 level and bilaterally exposed the L4-5 spinous process lamina out to the facets and transverse processes of 4 and 5 were exposed. Markers were placed at the pressure points and the 45 interspace another x-rays obtained and this confirmed our positioning. Checking retractors were  placed for decompression laminectomy removing the spinous process and inferior part of 4 the top of 5 lamina of 4 and facetectomies done two thirds facetectomies and bilaterally at L2 and decompress the central canal and lateral recess and nerve roots the L4 and L5 nerve roots were decompressed they were under some significant to compression of the spinal thesis is much more decompression of the nerve was then needed to adjust for posterior fusion. After very wide decompression to decompress the nerve roots do space was explored the disc space incised 5 bilaterally discectomy started pituitary rongeurs we then distracted interspace up to 10 mm this did reduce the spinal thesis prepared the interbody space for interbody fusion using a curettes and scrapers and broaches then packed to 10 I interbody cages with autograft bone we packed autograft bone in the the space and the distraction and one side tapped the cage in the contralateral side removed the distraction placed the cage on each lateral side. Good position of the interbody cages and again there is some restoration of alignment. We entered explored nerve root redo decompression of both the 4 and 5 roots along with the central canal. Using fluoroscopy and intraocular marks the pedicle at point for L4 and 5 used high-speed drill to decorticate placed a probe down the pedicle checked with a small ball probe and sure he did bony circumference tapped on then placed Expedium pedicle screw is into the L4 and L5 bilaterally. Final AP and lateral FluoroScan images showing good position of the screws and interbody cages. Roger placed in the screw heads locking nuts placed and these were final tightened we then placed the autograft bone and allograft sponge and soaked in bone marrow aspirate in the  posterolateral gutters for posterior lateral fusion. We tapped the pedicles we aspirated the bone marrow from each of the pedicles is oriented to the allograft sponge. The  anterior that about solution again explore the nerve retracted did decompression is Gelfoam over the lateral gutters retractors removed with very good hemostasis fascia closed with 0 Vicryl interrupted sutures subcutaneous tissue closed with 021 through Vicryl interrupted sutures skin closed benzoin Steri-Strips dressing was placed patient placed back in spine position woken (and transferred to recovery.   PLAN OF CARE: Admit to inpatient   PATIENT DISPOSITION:  PACU - hemodynamically stable.

## 2013-06-09 NOTE — Anesthesia Procedure Notes (Signed)
Procedure Name: Intubation Date/Time: 06/09/2013 10:48 AM Performed by: Leona Singleton A Pre-anesthesia Checklist: Patient identified, Emergency Drugs available, Suction available and Patient being monitored Patient Re-evaluated:Patient Re-evaluated prior to inductionOxygen Delivery Method: Circle system utilized Preoxygenation: Pre-oxygenation with 100% oxygen Intubation Type: IV induction Ventilation: Mask ventilation without difficulty Laryngoscope Size: Miller and 2 Grade View: Grade III Tube type: Oral Tube size: 7.0 mm Number of attempts: 1 Airway Equipment and Method: Stylet Placement Confirmation: ETT inserted through vocal cords under direct vision,  positive ETCO2 and breath sounds checked- equal and bilateral Secured at: 20 cm Tube secured with: Tape Dental Injury: Teeth and Oropharynx as per pre-operative assessment

## 2013-06-09 NOTE — Transfer of Care (Signed)
Immediate Anesthesia Transfer of Care Note  Patient: Jessica Williams  Procedure(s) Performed: Procedure(s) with comments: POSTERIOR LUMBAR FUSION 1 LEVEL (N/A) - Lumbar four-five Posterior lumbar interbody fusion/expedium screws/sabre cages   Patient Location: PACU  Anesthesia Type:General  Level of Consciousness: awake, alert , oriented and patient cooperative  Airway & Oxygen Therapy: Patient Spontanous Breathing and Patient connected to nasal cannula oxygen  Post-op Assessment: Report given to PACU RN, Post -op Vital signs reviewed and stable and Patient moving all extremities X 4  Post vital signs: Reviewed and stable  Complications: No apparent anesthesia complications

## 2013-06-09 NOTE — Anesthesia Preprocedure Evaluation (Addendum)
Anesthesia Evaluation  Patient identified by MRN, date of birth, ID band Patient awake    Reviewed: Allergy & Precautions, H&P , NPO status , Patient's Chart, lab work & pertinent test results  History of Anesthesia Complications (+) PONV  Airway Mallampati: II TM Distance: >3 FB Neck ROM: Full    Dental  (+) Teeth Intact and Dental Advisory Given   Pulmonary neg pulmonary ROS,  breath sounds clear to auscultation        Cardiovascular hypertension, Pt. on medications and Pt. on home beta blockers Rhythm:Regular Rate:Normal     Neuro/Psych  Neuromuscular disease (pain and numbness LLE>RLE) negative psych ROS   GI/Hepatic Neg liver ROS, GERD- (no current issues)  Controlled,  Endo/Other    Renal/GU negative Renal ROS  negative genitourinary   Musculoskeletal  (+) Arthritis -, Osteoarthritis,    Abdominal (+) + obese,   Peds  Hematology negative hematology ROS (+)   Anesthesia Other Findings   Reproductive/Obstetrics negative OB ROS                       Anesthesia Physical Anesthesia Plan  ASA: II  Anesthesia Plan: General   Post-op Pain Management:    Induction: Intravenous  Airway Management Planned: Oral ETT  Additional Equipment:   Intra-op Plan:   Post-operative Plan: Extubation in OR  Informed Consent:   Dental advisory given  Plan Discussed with: CRNA and Surgeon  Anesthesia Plan Comments:         Anesthesia Quick Evaluation

## 2013-06-09 NOTE — Anesthesia Postprocedure Evaluation (Signed)
  Anesthesia Post-op Note  Patient: Jessica Williams  Procedure(s) Performed: Procedure(s) with comments: POSTERIOR LUMBAR FUSION 1 LEVEL (N/A) - Lumbar four-five Posterior lumbar interbody fusion/expedium screws/sabre cages   Patient Location: PACU  Anesthesia Type:General  Level of Consciousness: awake  Airway and Oxygen Therapy: Patient Spontanous Breathing  Post-op Pain: mild  Post-op Assessment: Post-op Vital signs reviewed  Post-op Vital Signs: stable  Complications: No apparent anesthesia complications

## 2013-06-09 NOTE — Progress Notes (Signed)
UR COMPLETED  

## 2013-06-09 NOTE — H&P (Signed)
See H& P.

## 2013-06-09 NOTE — Interval H&P Note (Signed)
History and Physical Interval Note:  06/09/2013 10:38 AM  Jessica Williams  has presented today for surgery, with the diagnosis of Spondylolisthesis, Lumbar stenosis, Lumbar spondylosis, Lumbar radiculopathy  The various methods of treatment have been discussed with the patient and family. After consideration of risks, benefits and other options for treatment, the patient has consented to  Procedure(s) with comments: POSTERIOR LUMBAR FUSION 1 LEVEL (N/A) - L4-5 Posterior lumbar interbody fusion/expedium screws/sabre cages  as a surgical intervention .  The patient's history has been reviewed, patient examined, no change in status, stable for surgery.  I have reviewed the patient's chart and labs.  Questions were answered to the patient's satisfaction.     Azaria Bartell R

## 2013-06-10 MED ORDER — SODIUM CHLORIDE 0.9 % IV BOLUS (SEPSIS)
250.0000 mL | Freq: Once | INTRAVENOUS | Status: AC
Start: 2013-06-10 — End: 2013-06-10
  Administered 2013-06-10: 250 mL via INTRAVENOUS

## 2013-06-10 MED ORDER — CYCLOBENZAPRINE HCL 10 MG PO TABS: 10.0000 mg | ORAL_TABLET | Freq: Three times a day (TID) | ORAL | Status: DC | PRN

## 2013-06-10 MED ORDER — TRAMADOL HCL 50 MG PO TABS: 50.0000 mg | ORAL_TABLET | Freq: Four times a day (QID) | ORAL | Status: DC | PRN

## 2013-06-10 MED ORDER — MORPHINE SULFATE 2 MG/ML IJ SOLN: 2.0000 mg | INTRAMUSCULAR | Status: DC | PRN

## 2013-06-10 MED ORDER — HYDROCODONE-ACETAMINOPHEN 5-325 MG PO TABS: 1.0000 | ORAL_TABLET | ORAL | Status: DC | PRN

## 2013-06-10 MED ORDER — OXYCODONE-ACETAMINOPHEN 5-325 MG PO TABS: 1.0000 | ORAL_TABLET | ORAL | Status: DC | PRN

## 2013-06-10 MED ORDER — OXYCODONE-ACETAMINOPHEN 5-325 MG PO TABS
1.0000 | ORAL_TABLET | ORAL | Status: DC | PRN
  Administered 2013-06-10 – 2013-06-11 (×5): 2 via ORAL
  Filled 2013-06-10 (×5): qty 2

## 2013-06-10 NOTE — Progress Notes (Signed)
I agree with the following treatment note after reviewing documentation.   Johnston, Elayna Tobler Brynn   OTR/L Pager: [phone] Office: [phone] .   

## 2013-06-10 NOTE — Progress Notes (Signed)
Pt having no difficulty voiding.  Voided 600 ml at this time.   BP 114/66

## 2013-06-10 NOTE — Progress Notes (Signed)
Per NT pt voided approximately 300; post void residual was 119.  Encouraging fluids and pt is receiving a 250 bolus of NS per MD order. BP 95/49 automatic and 90/58 manually.  Pt is asymptomatic.  Will continue to monitor BP and urine output.

## 2013-06-10 NOTE — Progress Notes (Signed)
Doing well. C/o appropriate incisional soreness. No leg pain No Numbness, tingling, weakness No Nausea /vomiting Amb/ voiding well  Temp:  [97.4 F (36.3 C)-99 F (37.2 C)] 98.1 F (36.7 C) (07/25 0529) Pulse Rate:  [66-97] 78 (07/25 0533) Resp:  [5-20] 16 (07/25 0529) BP: (94-141)/(53-83) 94/60 mmHg (07/25 0533) SpO2:  [90 %-98 %] 97 % (07/25 0529) Weight:  [96.7 kg (213 lb 3 oz)] 96.7 kg (213 lb 3 oz) (07/24 2256) Good strength and sensation Incision CDI  Plan: Increase activity  - change to po meds  - ? D/c in am

## 2013-06-10 NOTE — Evaluation (Signed)
Physical Therapy Evaluation Patient Details Name: Jessica Williams MRN: [id number] DOB: 30-Nov-1955 Today's Date: 06/10/2013 Time: 9147-8295 PT Time Calculation (min): 31 min  PT Assessment / Plan / Recommendation History of Present Illness  Pt s/p L45 PLIF due to spondylolisthesis  Clinical Impression  Pt admitted for elective PLIF L45.Marland Kitchen Pt currently with functional limitations due to the deficits listed below (see PT Problem List).  Have instructed pt in back care/precautions and she verbalizes understanding.   Pt will benefit from skilled PT to increase their independence and safety with mobility to allow discharge to home with available assist.      PT Assessment  Patient needs continued PT services    Follow Up Recommendations  No PT follow up;Supervision for mobility/OOB    Does the patient have the potential to tolerate intense rehabilitation      Barriers to Discharge        Equipment Recommendations  None recommended by PT    Recommendations for Other Services     Frequency Min 5X/week    Precautions / Restrictions Precautions Precautions: Back Required Braces or Orthoses: Spinal Brace Spinal Brace: Lumbar corset   Pertinent Vitals/Pain       Mobility  Bed Mobility Bed Mobility: Sitting - Scoot to Edge of Bed;Left Sidelying to Sit;Sit to Sidelying Left;Rolling Right;Rolling Left Rolling Right: 5: Supervision Rolling Left: 5: Supervision Left Sidelying to Sit: 4: Min guard Sitting - Scoot to Edge of Bed: 5: Supervision Sit to Sidelying Left: 4: Min guard Details for Bed Mobility Assistance: instructed in and practiced safe bed mob/logroll Transfers Transfers: Sit to Stand;Stand to Sit Sit to Stand: 4: Min guard;With upper extremity assist;From chair/3-in-1 Stand to Sit: 4: Min guard;With upper extremity assist;To bed Details for Transfer Assistance: reinforced safe technique, cues for hand placement Ambulation/Gait Ambulation/Gait Assistance: 4: Min  guard Ambulation Distance (Feet): 150 Feet Assistive device: None (occ use of rail for confidence to incr. gait speed) Ambulation/Gait Assistance Details: guarded, but steady Gait Pattern: Step-through pattern Stairs: No    Exercises     PT Diagnosis: Acute pain  PT Problem List: Decreased activity tolerance;Decreased mobility;Decreased knowledge of use of DME;Decreased knowledge of precautions;Pain PT Treatment Interventions: DME instruction;Gait training;Stair training;Functional mobility training;Therapeutic activities;Patient/family education     PT Goals(Current goals can be found in the care plan section) Acute Rehab PT Goals Patient Stated Goal: Eventually back to work PT Goal Formulation: With patient Time For Goal Achievement: 06/17/13 Potential to Achieve Goals: Good  Visit Information  Last PT Received On: 06/10/13 Assistance Needed: +1 History of Present Illness: Pt s/p L45 PLIF due to spondylolisthesis       Prior Functioning  Home Living Family/patient expects to be discharged to:: Private residence Living Arrangements: Spouse/significant other Available Help at Discharge: Family;Available 24 hours/day Type of Home: House Home Access: Level entry Home Layout: One level Home Equipment: Walker - 2 wheels;Bedside commode;Cane - single point Additional Comments: Had recent knee replacement, exercises and DME Prior Function Level of Independence: Independent Communication Communication: No difficulties    Cognition  Cognition Arousal/Alertness: Awake/alert Behavior During Therapy: WFL for tasks assessed/performed Overall Cognitive Status: Within Functional Limits for tasks assessed    Extremity/Trunk Assessment Lower Extremity Assessment Lower Extremity Assessment: Overall WFL for tasks assessed Cervical / Trunk Assessment Cervical / Trunk Assessment: Normal   Balance Balance Balance Assessed: No  End of Session PT - End of Session Equipment Utilized  During Treatment: Back brace Activity Tolerance: Patient tolerated treatment well Patient left: in  bed;with call bell/phone within reach Nurse Communication: Mobility status  GP     Jakhai Fant, Eliseo Gum 06/10/2013, 12:07 PM 06/10/2013  [place] Bing, PT [phone]  (pager)

## 2013-06-10 NOTE — Progress Notes (Signed)
OT Cancellation Note  Patient Details Name: Jessica Williams MRN: [id number] DOB: Sep 13, 1956   Cancelled Treatment:    Reason Eval/Treat Not Completed: Medical issues which prohibited therapy. Pt currently with low BP RN French Ana requesting that we hold at this time.   Sherryl Manges 06/10/2013, 2:36 PM

## 2013-06-11 NOTE — Evaluation (Signed)
Occupational Therapy Evaluation Patient Details Name: Jessica Williams MRN: [id number] DOB: 05-20-1956 Today's Date: 06/11/2013 Time: 4098-1191 OT Time Calculation (min): 22 min  OT Assessment / Plan / Recommendation History of present illness Pt s/p L45 PLIF due to spondylolisthesis   Clinical Impression   Pt s/p L4-5 PLIF.  Pt overall supervision-min assist with ADLs. Pt reports she has necessary level of assist from family at home. No DME needs. Education completed. OT will sign off.    OT Assessment  Patient does not need any further OT services    Follow Up Recommendations  No OT follow up;Supervision/Assistance - 24 hour    Barriers to Discharge      Equipment Recommendations  None recommended by OT    Recommendations for Other Services    Frequency       Precautions / Restrictions Precautions Precautions: Back Precaution Comments: Pt able to recall 3/3 back precautions & does well maintaining with functional mobility Required Braces or Orthoses: Spinal Brace Spinal Brace: Lumbar corset Restrictions Weight Bearing Restrictions: No   Pertinent Vitals/Pain See vitals    ADL  Grooming: Performed;Wash/dry hands;Modified independent Where Assessed - Grooming: Unsupported standing Upper Body Bathing: Simulated;Set up Where Assessed - Upper Body Bathing: Unsupported sitting Lower Body Bathing: Simulated;Minimal assistance (without AE) Where Assessed - Lower Body Bathing: Unsupported sit to stand Upper Body Dressing: Performed;Set up Where Assessed - Upper Body Dressing: Unsupported sitting Lower Body Dressing: Performed;Minimal assistance (without AE) Where Assessed - Lower Body Dressing: Unsupported sit to stand Toilet Transfer: Performed;Supervision/safety Toilet Transfer Method:  (ambulating) Acupuncturist: Comfort height toilet Toileting - Clothing Manipulation and Hygiene: Performed;Modified independent Where Assessed - Toileting Clothing Manipulation  and Hygiene: Sit to stand from 3-in-1 or toilet Tub/Shower Transfer: Simulated;Supervision/safety Tub/Shower Transfer Method: Science writer: Walk in shower Equipment Used: Back brace;Reacher;Long-handled sponge Transfers/Ambulation Related to ADLs: supervision ambulating in room ADL Comments: Pt reports her husband is home 24/7 and can assist as needed.  Educated pt on use of reacher and long handled sponge for LB bathing/dressing tasks, and pt able to return demonstration. Min assist for LB ADLs without use of AE.  Pt able to perform walk in shower simulation at supervision. Recommended pt use 3n1 as shower chair initially for safety.    OT Diagnosis:    OT Problem List:   OT Treatment Interventions:     OT Goals(Current goals can be found in the care plan section) Acute Rehab OT Goals Patient Stated Goal: Eventually back to work  Visit Information  Last OT Received On: 06/11/13 Assistance Needed: +1 History of Present Illness: Pt s/p L45 PLIF due to spondylolisthesis       Prior Functioning     Home Living Family/patient expects to be discharged to:: Private residence Living Arrangements: Spouse/significant other Available Help at Discharge: Family;Available 24 hours/day Type of Home: House Home Access: Level entry Home Layout: One level Home Equipment: Walker - 2 wheels;Bedside commode;Cane - single point Additional Comments: Had recent knee replacement, exercises and DME Prior Function Level of Independence: Independent Communication Communication: No difficulties         Vision/Perception     Cognition  Cognition Arousal/Alertness: Awake/alert Behavior During Therapy: WFL for tasks assessed/performed Overall Cognitive Status: Within Functional Limits for tasks assessed    Extremity/Trunk Assessment Upper Extremity Assessment Upper Extremity Assessment: Overall WFL for tasks assessed     Mobility Bed Mobility Bed Mobility: Not  assessed Rolling Left: 6: Modified independent (Device/Increase time) Left Sidelying to  Sit: 6: Modified independent (Device/Increase time);HOB flat Sitting - Scoot to Edge of Bed: 6: Modified independent (Device/Increase time) Sit to Sidelying Left: 6: Modified independent (Device/Increase time);HOB flat Details for Bed Mobility Assistance: Pt demonstrated safe/proper technique Transfers Transfers: Sit to Stand;Stand to Sit Sit to Stand: 5: Supervision;From chair/3-in-1;From toilet;With upper extremity assist Stand to Sit: 5: Supervision;To chair/3-in-1;To toilet;With upper extremity assist Details for Transfer Assistance: supervision for safety     Exercise     Balance Balance Balance Assessed: No   End of Session OT - End of Session Equipment Utilized During Treatment: Back brace Activity Tolerance: Patient tolerated treatment well Patient left: in chair;with call bell/phone within reach Nurse Communication: Mobility status  GO   06/11/2013 Cipriano Mile OTR/L Pager [phone] Office [phone]   Cipriano Mile 06/11/2013, 10:38 AM

## 2013-06-11 NOTE — Discharge Summary (Signed)
Physician Discharge Summary  Patient ID: Jessica Williams MRN: [id number] DOB/AGE: 56-03-1956 56 y.o.  Admit date: 06/09/2013 Discharge date: 06/11/2013  Admission Diagnoses:Spondylolisthesis, Lumbar stenosis, Lumbar spondylosis, Lumbar radiculopathy, L4-5   Discharge Diagnoses: Spondylolisthesis, Lumbar stenosis, Lumbar spondylosis, Lumbar radiculopathy, L4-5  Active Problems:   * No active hospital problems. *   Discharged Condition: good  Hospital Course: [name] was admitted and taken to the operating room where she underwent a Decompressive laminectomy decompressing L4 and L5 roots (2 levels) ,  PLIF L4-5,  interbody cages L4-5 , nonsegmented pedicle screw fixation L4-5 with expedium screws L4-5 ,  posterlateral fusion L4-5 , autograft , allograft , bone marrow aspirate  Without difficulty. Post op she has ambulated, voided, and eaten a regular diet. The wound is clean, dry, and without signs of infection.   Consults: None  Significant Diagnostic Studies: none  Treatments: surgery: Decompressive laminectomy decompressing L4 and L5 roots (2 levels) ,  PLIF L4-5,  interbody cages L4-5 , nonsegmented pedicle screw fixation L4-5 with expedium screws L4-5 ,  posterlateral fusion L4-5 , autograft , allograft , bone marrow aspirate    Discharge Exam: Blood pressure 108/59, pulse 79, temperature 98.1 F (36.7 C), temperature source Oral, resp. rate 19, height 5\' 6"  (1.676 m), weight 96.7 kg (213 lb 3 oz), SpO2 97.00%. General appearance: alert, cooperative, appears stated age and no distress Neurologic: Alert and oriented X 3, normal strength and tone. Normal symmetric reflexes. Normal coordination and gait  Disposition: 06-Home-Health Care Svc     Medication List    STOP taking these medications       meloxicam 15 MG tablet  Commonly known as:  MOBIC      TAKE these medications       atenolol 50 MG tablet  Commonly known as:  TENORMIN  Take 50 mg by mouth daily.     atorvastatin 40 MG tablet  Commonly known as:  LIPITOR  Take 40 mg by mouth daily.     citalopram 10 MG tablet  Commonly known as:  CELEXA  Take 10 mg by mouth daily.     cyclobenzaprine 10 MG tablet  Commonly known as:  FLEXERIL  Take 1 tablet (10 mg total) by mouth 3 (three) times daily as needed for muscle spasms.     lisinopril 10 MG tablet  Commonly known as:  PRINIVIL,ZESTRIL  Take 10 mg by mouth daily.     oxyCODONE-acetaminophen 5-325 MG per tablet  Commonly known as:  PERCOCET/ROXICET  Take 1-2 tablets by mouth every 4 (four) hours as needed.     VITAMIN D-3 PO  Take 1 capsule by mouth daily.         Signed: Athira Janowicz L 06/11/2013, 8:44 AM

## 2013-06-11 NOTE — Progress Notes (Signed)
Physical Therapy Treatment Patient Details Name: Jessica Williams MRN: [id number] DOB: 10-29-1956 Today's Date: 06/11/2013 Time: 4098-1191 PT Time Calculation (min): 13 min  PT Assessment / Plan / Recommendation  History of Present Illness Pt s/p L45 PLIF due to spondylolisthesis   Clinical Impression Pt making steady progress with mobility & PT goals.  Moving well.        Follow Up Recommendations  No PT follow up;Supervision for mobility/OOB     Does the patient have the potential to tolerate intense rehabilitation     Barriers to Discharge        Equipment Recommendations  None recommended by PT    Recommendations for Other Services    Frequency Min 5X/week   Progress towards PT Goals Progress towards PT goals: Progressing toward goals  Plan Current plan remains appropriate    Precautions / Restrictions Precautions Precautions: Back Precaution Comments: Pt able to recall 3/3 back precautions & does well maintaining with functional mobility Required Braces or Orthoses: Spinal Brace Spinal Brace: Lumbar corset Restrictions Weight Bearing Restrictions: No   Pertinent Vitals/Pain C/o mild discomfort in back but did not rate.      Mobility  Bed Mobility Bed Mobility: Rolling Left;Left Sidelying to Sit;Supine to Sit;Sit to Sidelying Left Rolling Left: 6: Modified independent (Device/Increase time) Left Sidelying to Sit: 6: Modified independent (Device/Increase time);HOB flat Sitting - Scoot to Edge of Bed: 6: Modified independent (Device/Increase time) Sit to Sidelying Left: 6: Modified independent (Device/Increase time);HOB flat Details for Bed Mobility Assistance: Pt demonstrated safe/proper technique Transfers Transfers: Sit to Stand;Stand to Sit Sit to Stand: 5: Supervision;With upper extremity assist;From bed Stand to Sit: 5: Supervision;With upper extremity assist;To bed Details for Transfer Assistance: supervision for safety Ambulation/Gait Ambulation/Gait  Assistance: 5: Supervision Ambulation Distance (Feet): 200 Feet Assistive device: None Ambulation/Gait Assistance Details: cont's to be guarded but overall steady with no LOB noted.  Encouragement to relax & perform with reciprocal arm swing Gait Pattern: Step-through pattern Stairs: No Wheelchair Mobility Wheelchair Mobility: No      PT Goals (current goals can now be found in the care plan section) Acute Rehab PT Goals Patient Stated Goal: Eventually back to work PT Goal Formulation: With patient Time For Goal Achievement: 06/17/13 Potential to Achieve Goals: Good  Visit Information  Last PT Received On: 06/11/13 Assistance Needed: +1 History of Present Illness: Pt s/p L45 PLIF due to spondylolisthesis    Subjective Data  Patient Stated Goal: Eventually back to work   Cognition  Cognition Arousal/Alertness: Awake/alert Behavior During Therapy: WFL for tasks assessed/performed Overall Cognitive Status: Within Functional Limits for tasks assessed    Balance  Balance Balance Assessed: No  End of Session PT - End of Session Equipment Utilized During Treatment: Back brace;Gait belt Activity Tolerance: Patient tolerated treatment well Patient left: in bed;with call bell/phone within reach;with nursing/sitter in room Nurse Communication: Mobility status   GP     Lara Mulch 06/11/2013, 10:29 AM  Verdell Face, PTA [phone] 06/11/2013

## 2013-06-11 NOTE — Progress Notes (Signed)
Pt vitals WNL, no complaints, IV removed, surgery education and medications reviewed with pt and family. Informed pt of need to schedule a follow up with Dr Phoebe Perch. To be discharged home with husband. Samar Venneman, [place] Marie, RN

## 2013-06-13 MED FILL — Heparin Sodium (Porcine) Inj 1000 Unit/ML: INTRAMUSCULAR | Qty: 30 | Status: AC

## 2013-06-13 MED FILL — Sodium Chloride IV Soln 0.9%: INTRAVENOUS | Qty: 1000 | Status: AC

## 2016-02-05 DIAGNOSIS — I10 Essential (primary) hypertension: Secondary | ICD-10-CM | POA: Insufficient documentation

## 2016-02-05 DIAGNOSIS — R7301 Impaired fasting glucose: Secondary | ICD-10-CM | POA: Insufficient documentation

## 2016-02-05 DIAGNOSIS — E669 Obesity, unspecified: Secondary | ICD-10-CM | POA: Insufficient documentation

## 2016-02-05 DIAGNOSIS — E782 Mixed hyperlipidemia: Secondary | ICD-10-CM | POA: Insufficient documentation

## 2016-02-05 DIAGNOSIS — K219 Gastro-esophageal reflux disease without esophagitis: Secondary | ICD-10-CM | POA: Insufficient documentation

## 2016-02-05 DIAGNOSIS — R5383 Other fatigue: Secondary | ICD-10-CM

## 2016-02-05 DIAGNOSIS — F339 Major depressive disorder, recurrent, unspecified: Secondary | ICD-10-CM | POA: Insufficient documentation

## 2016-02-05 DIAGNOSIS — Z79899 Other long term (current) drug therapy: Secondary | ICD-10-CM | POA: Insufficient documentation

## 2016-02-05 DIAGNOSIS — R5381 Other malaise: Secondary | ICD-10-CM | POA: Insufficient documentation

## 2016-05-05 ENCOUNTER — Encounter: Payer: Self-pay | Admitting: Podiatry

## 2016-05-05 ENCOUNTER — Ambulatory Visit (INDEPENDENT_AMBULATORY_CARE_PROVIDER_SITE_OTHER): Payer: BLUE CROSS/BLUE SHIELD | Admitting: Podiatry

## 2016-05-05 ENCOUNTER — Ambulatory Visit (INDEPENDENT_AMBULATORY_CARE_PROVIDER_SITE_OTHER): Payer: BLUE CROSS/BLUE SHIELD

## 2016-05-05 VITALS — BP 129/83 | HR 70 | Resp 14

## 2016-05-05 DIAGNOSIS — R52 Pain, unspecified: Secondary | ICD-10-CM

## 2016-05-05 DIAGNOSIS — M722 Plantar fascial fibromatosis: Secondary | ICD-10-CM | POA: Diagnosis not present

## 2016-05-05 NOTE — Progress Notes (Signed)
   Subjective:    Patient ID: Jessica Williams, female    DOB: [birth year], 60 y.o.   MRN: [id number]  HPI this patient presents the office with chief complaint of pain in her left heel. She states her pain has been present for approximately 4 weeks. She says she has had plantar fasciitis-itis in both heels, but this is different. She says she has pain upon rising and standing from a sitting position. She believes this is aggravated through her standing in her her shoes at work since the pain has returned she has started wearing her orthotics.  She also states that she has a dermatitis for which she received a Kenalog injection and a prescription for prednisone. She states she received this this past Friday. She presents the office today for an evaluation and treatment of this condition    Review of Systems  All other systems reviewed and are negative.      Objective:   Physical Exam GENERAL APPEARANCE: Alert, conversant. Appropriately groomed. No acute distress.  VASCULAR: Pedal pulses are  palpable at  [facility] and [facility] bilateral.  Capillary refill time is immediate to all digits,  Normal temperature gradient.  Digital hair growth is present bilateral  NEUROLOGIC: sensation is normal to 5.07 monofilament at 5/5 sites bilateral.  Light touch is intact bilateral, Muscle strength normal.  MUSCULOSKELETAL: acceptable muscle strength, tone and stability bilateral.  Intrinsic muscluature intact bilateral.  Rectus appearance of foot and digits noted bilateral. Palpable pain along the medial rim of left foot.    DERMATOLOGIC: skin color, texture, and turgor are within normal limits.  No preulcerative lesions or ulcers  are seen, no interdigital maceration noted.  No open lesions present.  Digital nails are asymptomatic. No drainage noted.         Assessment & Plan:  Plantar fascitis  Neuroma left heel.   IE  Xrays reveal spurring at the insertion plantar fascia.  Midfoot dorsal arthritis noted.  Long  first metatarsal noted.  I told this patient this is not the regular spot for plantar fascial pain. I wonder if she might have a neuroma-like pain from the calcaneal nerve at the medial aspect of the left heel. I told the patient that she is on so much prednisone. I prefer not to treat her with injection therapy, or even medication. I did add a heel pad to her orthotics which seem to be functioning well. I told her to try the padded orthotics and if the problem persists in the next few weeks to return to the office and we will provide injection therapy at that time.  RTC prn   Helane GuntherGregory Rivka Baune DPM

## 2016-07-09 ENCOUNTER — Ambulatory Visit: Payer: BLUE CROSS/BLUE SHIELD | Admitting: Podiatry

## 2016-07-09 ENCOUNTER — Encounter: Payer: Self-pay | Admitting: Sports Medicine

## 2016-07-09 ENCOUNTER — Encounter (INDEPENDENT_AMBULATORY_CARE_PROVIDER_SITE_OTHER): Payer: Self-pay

## 2016-07-09 ENCOUNTER — Ambulatory Visit (INDEPENDENT_AMBULATORY_CARE_PROVIDER_SITE_OTHER): Payer: BLUE CROSS/BLUE SHIELD | Admitting: Sports Medicine

## 2016-07-09 DIAGNOSIS — M722 Plantar fascial fibromatosis: Secondary | ICD-10-CM

## 2016-07-09 DIAGNOSIS — M79672 Pain in left foot: Secondary | ICD-10-CM

## 2016-07-09 DIAGNOSIS — L600 Ingrowing nail: Secondary | ICD-10-CM

## 2016-07-09 MED ORDER — TRIAMCINOLONE ACETONIDE 10 MG/ML IJ SUSP: 10.0000 mg | Freq: Once | INTRAMUSCULAR | Status: AC

## 2016-07-09 MED ORDER — MELOXICAM 15 MG PO TABS: 15.0000 mg | ORAL_TABLET | Freq: Every day | ORAL | 0 refills | Status: DC

## 2016-07-09 NOTE — Progress Notes (Signed)
Subjective: Jessica Williams is a 60 y.o. female patient presents to office with complaint of heel pain on the left. 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 Patient has treated this problem with pads on old orthotics as done by Dr. Stacie AcresMayer last visit with no relief. Admits to ingrowing nail at left 2nd toe medial margin that recurs; has treated it by trimming. Denies any other pedal complaints.   Patient Active Problem List   Diagnosis Date Noted  . Essential hypertension 02/05/2016  . Gastroesophageal reflux disease without esophagitis 02/05/2016  . Hypercalcemia 02/05/2016  . IFG (impaired fasting glucose) 02/05/2016  . Long-term use of high-risk medication 02/05/2016  . Major depressive disorder, recurrent (HCC) 02/05/2016  . Malaise and fatigue 02/05/2016  . Mixed hyperlipidemia 02/05/2016  . Obese 02/05/2016    Current Outpatient Prescriptions on File Prior to Visit  Medication Sig Dispense Refill  . Cholecalciferol (VITAMIN D-1000 MAX ST) 1000 units tablet Take by mouth.    . Cinnamon 500 MG capsule Take 500 mg by mouth.    . citalopram (CELEXA) 10 MG tablet Take 20 mg by mouth.    . fexofenadine (ALLEGRA) 180 MG tablet Take 180 mg by mouth.    . predniSONE (DELTASONE) 10 MG tablet     . triamcinolone cream (KENALOG) 0.1 % Apply topically.     No current facility-administered medications on file prior to visit.     Allergies  Allergen Reactions  . Bacitracin     Objective: Physical Exam General: The patient is alert and oriented x3 in no acute distress.  Dermatology: Skin is warm, dry and supple bilateral lower extremities. Nails 1-10 are normal except at Left 2nd toe medial aspect where there is mild ingrowing without acute infection. There is no erythema, edema, no eccymosis, no open lesions present. Integument is otherwise unremarkable.  Vascular: Dorsalis Pedis pulse and Posterior Tibial pulse are 2/4 bilateral. Capillary fill time is immediate to all  digits.  Neurological: Grossly intact to light touch with an achilles reflex of +2/5 and a negative Tinel's sign bilateral.  Musculoskeletal: Tenderness to palpation at the medial aspect at Left 2nd toenail and medial calcaneal tubercale and through the insertion of the plantar fascia on the left foot. No pain with compression of calcaneus bilateral. No pain with tuning fork to calcaneus bilateral. No pain with calf compression bilateral. There is decreased Ankle joint range of motion bilateral. All other joints range of motion within normal limits bilateral. Strength 5/5 in all groups bilateral.   Assessment and Plan: Problem List Items Addressed This Visit    None    Visit Diagnoses    Plantar fasciitis of left foot    -  Primary   Relevant Medications   triamcinolone acetonide (KENALOG) 10 MG/ML injection 10 mg   meloxicam (MOBIC) 15 MG tablet   Ingrown toenail without infection       Relevant Medications   triamcinolone acetonide (KENALOG) 10 MG/ML injection 10 mg   meloxicam (MOBIC) 15 MG tablet   Foot pain, left       Relevant Medications   triamcinolone acetonide (KENALOG) 10 MG/ML injection 10 mg   meloxicam (MOBIC) 15 MG tablet     -Complete examination performed.  -Previous Xrays reviewed -Discussed with patient in detail the condition of plantar fasciitis, how this occurs and general treatment options. Explained both conservative and surgical treatments.  -After oral consent and aseptic prep, injected a mixture containing 1 ml of 2% plain lidocaine, 1 ml 0.5%  plain marcaine, 0.5 ml of kenalog 10 and 0.5 ml of dexamethasone phosphate into left heel. Post-injection care discussed with patient.  -Rx Meloxicam -Recommended good supportive shoes and advised patient to d/c old orthotics.  - Explained in detail the use of the fascial brace for left which was dispensed at today's visit. -Explained and dispensed to patient daily stretching exercises. -Recommend patient to ice  affected area 1-2x daily. -Mechanically debrided left 2nd toenail removing offending border at the medial aspect without incident. Advised antibiotic cream and soaking with Epsom salt x 1 week; May benefit from PNA if recurs. -Patient to return to office in 4 weeks for follow up or sooner if problems or questions arise.  Asencion Islamitorya Cherre Kothari, DPM

## 2016-07-09 NOTE — Patient Instructions (Signed)

## 2016-08-01 DIAGNOSIS — Z0000 Encounter for general adult medical examination without abnormal findings: Secondary | ICD-10-CM | POA: Insufficient documentation

## 2016-08-08 ENCOUNTER — Encounter (INDEPENDENT_AMBULATORY_CARE_PROVIDER_SITE_OTHER): Payer: Self-pay

## 2016-08-08 ENCOUNTER — Ambulatory Visit (INDEPENDENT_AMBULATORY_CARE_PROVIDER_SITE_OTHER): Payer: BLUE CROSS/BLUE SHIELD | Admitting: Sports Medicine

## 2016-08-08 ENCOUNTER — Encounter: Payer: Self-pay | Admitting: Sports Medicine

## 2016-08-08 VITALS — Resp 16 | Ht 65.5 in | Wt 186.0 lb

## 2016-08-08 DIAGNOSIS — M79672 Pain in left foot: Secondary | ICD-10-CM

## 2016-08-08 DIAGNOSIS — M722 Plantar fascial fibromatosis: Secondary | ICD-10-CM | POA: Diagnosis not present

## 2016-08-08 DIAGNOSIS — L600 Ingrowing nail: Secondary | ICD-10-CM | POA: Diagnosis not present

## 2016-08-08 MED ORDER — DICLOFENAC SODIUM 75 MG PO TBEC: 75.0000 mg | DELAYED_RELEASE_TABLET | Freq: Two times a day (BID) | ORAL | 0 refills | Status: AC

## 2016-08-08 MED ORDER — TRIAMCINOLONE ACETONIDE 40 MG/ML IJ SUSP: 20.0000 mg | Freq: Once | INTRAMUSCULAR | Status: AC

## 2016-08-08 NOTE — Progress Notes (Signed)
Subjective: Jessica Williams is a 60 y.o. female returns to office for follow up evaluation after Left heel injection for plantar fasciitis, injection #1 administered 3 weeks ago. Patient states that the injection seems to help her pain but its not better like she was hoping, still has throbbing sensation to the left heel and sometimes the pain moves around to side of foot and ankle. Reports no resolution with oral anti-inflammatory. Has been wearing brace which helps but sometimes feel like she needs more support at the ankle. Patient also states that she still hurt at the side of her left 2nd toe. Patient denies any other pedal complaints at this time.   Patient Active Problem List   Diagnosis Date Noted  . Wellness examination 08/01/2016  . Essential hypertension 02/05/2016  . Gastroesophageal reflux disease without esophagitis 02/05/2016  . Hypercalcemia 02/05/2016  . IFG (impaired fasting glucose) 02/05/2016  . Long-term use of high-risk medication 02/05/2016  . Major depressive disorder, recurrent (HCC) 02/05/2016  . Malaise and fatigue 02/05/2016  . Mixed hyperlipidemia 02/05/2016  . Obese 02/05/2016    Current Outpatient Prescriptions on File Prior to Visit  Medication Sig Dispense Refill  . atenolol (TENORMIN) 50 MG tablet Take 50 mg by mouth.    Marland Kitchen. atorvastatin (LIPITOR) 40 MG tablet Take 40 mg by mouth.    Marland Kitchen. atorvastatin (LIPITOR) 40 MG tablet     . Cholecalciferol (VITAMIN D-1000 MAX ST) 1000 units tablet Take by mouth.    . Cinnamon 500 MG capsule Take 500 mg by mouth.    . citalopram (CELEXA) 10 MG tablet Take 20 mg by mouth.    . citalopram (CELEXA) 10 MG tablet     . fexofenadine (ALLEGRA) 180 MG tablet Take 180 mg by mouth.    . meloxicam (MOBIC) 15 MG tablet Take 1 tablet (15 mg total) by mouth daily. 30 tablet 0  . predniSONE (DELTASONE) 10 MG tablet     . triamcinolone cream (KENALOG) 0.1 % Apply topically.    . triamcinolone cream (KENALOG) 0.1 % Apply topically.      Current Facility-Administered Medications on File Prior to Visit  Medication Dose Route Frequency Provider Last Rate Last Dose  . triamcinolone acetonide (KENALOG) 10 MG/ML injection 10 mg  10 mg Other Once Asencion Islamitorya Keasia Dubose, DPM        Allergies  Allergen Reactions  . Bacitracin Rash    Objective:   General:  Alert and oriented x 3, in no acute distress  Dermatology: Skin is warm, dry, and supple bilateral. Nails are within normal limits however there is mild tenderness to the medial margin 2nd toe left foot with no signs of infection. There is no lower extremity erythema, no eccymosis, no open lesions present bilateral.   Vascular: Dorsalis Pedis and Posterior Tibial pedal pulses are 2/4 bilateral. + hair growth noted bilateral. Capillary Fill Time is 3 seconds in all digits. No varicosities, No edema bilateral lower extremities.   Neurological: Sensation grossly intact to light touch with an achilles reflex of +2 and a negative Tinel's sign bilateral.   Musculoskeletal: There is mild tenderness to medial left 2nd toenail and mild tenderness to palpation at the medial calcaneal tubercale and through the insertion of the plantar fascia on the Left foot. No pain with compression to calcaneus or application of tuning fork. There is decreased Ankle joint range of motion bilateral. All other joints range of motion  within normal limits bilateral. Strength 5/5 bilateral.   Assessment and Plan: Problem  List Items Addressed This Visit    None    Visit Diagnoses    Plantar fasciitis of left foot    -  Primary   Relevant Medications   triamcinolone acetonide (KENALOG-40) injection 20 mg   Ingrown toenail without infection       Foot pain, left          -Complete examination performed.  -Discussed with patient in detail the condition of plantar fasciitis, how this occurs related to the foot type of the patient and general treatment options. - Patient opted for another injection today;  After oral consent and aseptic prep, injected a mixture containing [id number] [employer]c360Marcelino 37mKoreaDu94064Lodema HoWasRichardeanCounc475Marcelino 108mKoreaDu94064Lodema HoWasRichardeanCounc201Marcelino 43mKoreaDu94064Lodema HoWasRichardeanCounc437Marcelino 30mKoreaDu94064Lodema HoWasRichardeanCounc(201Marcelino 75mKoreaDu94064Lodema HoWasRichardeanCounc778Marcelino 50mKoreaDu94064Lodema HoWasRichardeanCounc703Marcelino 67mKoreaDu94064Lodema HoWasRichardeanCounc3Marcelino 33mKoreaDu94064Lodema HoWasRichardeanCounc9Marcelino 45mKoreaDu94064Lodema HoWasRichardeanCounc(209)Marcelino 46mKoreaDu94064Lodema HoWasRichardeanCounc(314Marcelino 107mKoreaDu94064Lodema HoWasRichardeanCounc314Marcelino 16mKoreaDu94064Lodema HoWasRichardeanCounc304Marcelino 36mKoreaDu94064Lodema HoWasRichardeanCounc206Marcelino 62mKoreaDu94064Lodema HoWasRichardeanCounc670Marcelino 64mKoreaDu94064Lodema HoWasRichardeanCounc731Marcelino 42mKoreaDu94064Lodema HoWasRichardeanCounc819Marcelino 60mKoreaDu94064Lodema HoWasRichardeanCounc(984Marcelino 14mKoreaDu94064Lodema HoWasRichardeanCounc671Marcelino 21mKoreaDu94064Lodema HoWasRichardeanCounc463Marcelino 68mKoreaDu94064Lodema HoWasRichardeanCounc831Marcelino 14mKoreaDu94064Lodema HoWasRichardeanCounc810Marcelino 48mKoreaDu94064Lodema HoWasRichardeanCounc929Marcelino 22mKoreaDu94064Lodema HoWasRichardeanCounc820Marcelino 16mKoreaDu94064Lodema HoWasRichardeanCounc770Marcelino 62mKoreaDu94064Lodema HoWasRichardeanCounc936Marcelino 36mKoreaDu94064Lodema HoWasRichardeanCounc(478Marcelino 40mKoreaDu94064Lodema HoWas[employer]eanCounc(504)Marcelino 45mKoreaDu94064Lodema HoWasRichardeanCounc916Marcelino 54mKoreaDu94064Lodema HoWas[employer]ean[employer]c(661Marcelino 58mKoreaDu94064Lodema HoWas[employer]ean[employer]c(541)Marcelino 78mKoreaDu94064Lodema HoWasRichardeanCounc(614Marcelino 67mKoreaDu94064Lodema HoWasRichardeanCounc9Marcelino 74mKoreaDu94064Lodema HoWas[employer]ean[employer]c(814)Marcelino 106mKoreaDu94064Lodema HoWasRichardeanCounc(939)Marcelino 69mKoreaDu94064Lodema HoWasRichardeanCounc(934Marcelino 4mKoreaDu94064Lodema HoWasRichardeanCounc7Marcelino 60mKoreaDu94064Lodema HoWasRichardeanCounc(727Marcelino 83mKoreaDu94064Lodema HoWasRichardeanCounc936Marcelino 19mKoreaDu94064Lodema HoWasRichardeanCounc747Marcelino 89mKoreaDu94064Lodema HoWasRichardeanCounc931Marcelino 92mKoreaDu94064Lodema HoWas[employer]ean[employer]c(340)Marcelino 26mKoreaDu94064Lodema HoWasRichardeanCounc239Marcelino 37mKoreaDu94064Lodema HoWas[employer]eanCounc231Marcelino 52mKoreaDu94064Lodema HoWasRichardeanCounc484Marcelino Duster4370icb lidocaine, 1 ml 0.5% plain marcaine, 0.5 ml of kenalog 40 and 0.5 ml of dexmethasone phosphate to left heel at area of most pain/trigger point injection.t -Rx Diclofenac  -Continue with stretching, icing, good supportive shoes, inserts daily.  -Discussed long term care and reocurrence; will closely monitor; if fails to improve will consider other treatment modalities.  -Trimmed left 2nd toenail for patient offering symptomatic relief if continues to be bothersome will have to do PNA. Recommend epsom salt soaks and antibiotic cream x 1week -Patient to return to office in 3-4 weeks for follow up or sooner if problems or questions arise.  Skyllar Notarianni, DPM

## 2016-08-29 ENCOUNTER — Ambulatory Visit (INDEPENDENT_AMBULATORY_CARE_PROVIDER_SITE_OTHER): Payer: BLUE CROSS/BLUE SHIELD | Admitting: Sports Medicine

## 2016-08-29 ENCOUNTER — Encounter: Payer: Self-pay | Admitting: Sports Medicine

## 2016-08-29 DIAGNOSIS — M79672 Pain in left foot: Secondary | ICD-10-CM

## 2016-08-29 DIAGNOSIS — M722 Plantar fascial fibromatosis: Secondary | ICD-10-CM

## 2016-08-29 DIAGNOSIS — L600 Ingrowing nail: Secondary | ICD-10-CM

## 2016-08-29 NOTE — Progress Notes (Signed)
Subjective: Jessica Williams is a 60 y.o. female returns to office for follow up evaluation after Left heel injection for plantar fasciitis, injection #2 administered 3 weeks ago. Patient states that the injection seems to help her pain and its better. Patient also states that she still hurt at the side of her left 2nd toe at the nail and wants to discuss nail procedure. Patient denies any other pedal complaints at this time.   Patient Active Problem List   Diagnosis Date Noted  . Wellness examination 08/01/2016  . Essential hypertension 02/05/2016  . Gastroesophageal reflux disease without esophagitis 02/05/2016  . Hypercalcemia 02/05/2016  . IFG (impaired fasting glucose) 02/05/2016  . Long-term use of high-risk medication 02/05/2016  . Major depressive disorder, recurrent (HCC) 02/05/2016  . Malaise and fatigue 02/05/2016  . Mixed hyperlipidemia 02/05/2016  . Obese 02/05/2016    Current Outpatient Prescriptions on File Prior to Visit  Medication Sig Dispense Refill  . atenolol (TENORMIN) 50 MG tablet Take 50 mg by mouth.    Marland Kitchen. atorvastatin (LIPITOR) 40 MG tablet Take 40 mg by mouth.    Marland Kitchen. atorvastatin (LIPITOR) 40 MG tablet     . Cholecalciferol (VITAMIN D-1000 MAX ST) 1000 units tablet Take by mouth.    . Cinnamon 500 MG capsule Take 500 mg by mouth.    . citalopram (CELEXA) 10 MG tablet Take 20 mg by mouth.    . citalopram (CELEXA) 10 MG tablet     . diclofenac (VOLTAREN) 75 MG EC tablet Take 1 tablet (75 mg total) by mouth 2 (two) times daily. 30 tablet 0  . fexofenadine (ALLEGRA) 180 MG tablet Take 180 mg by mouth.    . meloxicam (MOBIC) 15 MG tablet Take 1 tablet (15 mg total) by mouth daily. 30 tablet 0  . predniSONE (DELTASONE) 10 MG tablet     . triamcinolone cream (KENALOG) 0.1 % Apply topically.    . triamcinolone cream (KENALOG) 0.1 % Apply topically.     Current Facility-Administered Medications on File Prior to Visit  Medication Dose Route Frequency Provider Last Rate  Last Dose  . triamcinolone acetonide (KENALOG) 10 MG/ML injection 10 mg  10 mg Other Once [employer] Shyloh Derosa, DPM      . triamcinolone acetonide (KENALOG-40) injection 20 mg  20 mg Other Once Asencion Islamitorya Leeasia Secrist, DPM        Allergies  Allergen Reactions  . Bacitracin Rash    Objective:   General:  Alert and oriented x 3, in no acute distress  Dermatology: Skin is warm, dry, and supple bilateral. Nails are within normal limits however there is mild tenderness to the medial margin 2nd toe left foot with no signs of infection. There is no lower extremity erythema, no eccymosis, no open lesions present bilateral.   Vascular: Dorsalis Pedis and Posterior Tibial pedal pulses are 2/4 bilateral. + hair growth noted bilateral. Capillary Fill Time is 3 seconds in all digits. No varicosities, No edema bilateral lower extremities.   Neurological: Sensation grossly intact to light touch with an achilles reflex of +2 and a negative Tinel's sign bilateral.   Musculoskeletal: There is Mild tenderness to medial left 2nd toenail and decreased tenderness to palpation at the medial calcaneal tubercale and through the insertion of the plantar fascia on the Left foot. No pain with compression to calcaneus or application of tuning fork. There is decreased Ankle joint range of motion bilateral. All other joints range of motion  within normal limits bilateral. Strength 5/5 bilateral.  Assessment and Plan: Problem List Items Addressed This Visit    None    Visit Diagnoses    Ingrown toenail without infection    -  Primary   Foot pain, left       Plantar fasciitis of left foot          -Complete examination performed.  -Discussed with patient in detail the condition of plantar fasciitis, how this occurs related to the foot type of the patient and general treatment options. -Continue with Fascial, brace will plan to wean at next visit, stretching, icing, good supportive shoes, inserts daily.  -Discussed long term  care and reocurrence; will closely monitor; if fails to improve will consider other treatment modalities.  -Discussed treatment alternatives and plan of care; Explained permanent/temporary nail avulsion and post procedure course to patient. - After a verbal consent, injected 3 ml of a 50:50 mixture of 2% plain  lidocaine and 0.5% plain marcaine in a normal digital block fashion. Next, a  betadine prep was performed. Anesthesia was tested and found to be appropriate.  The offending left medial 2nd toe nail border was then incised from the hyponychium to the epinychium. The offending nail border was removed and cleared from the field. The area was curretted for any remaining nail or spicules. Phenol application performed and the area was then flushed with alcohol and dressed with antibiotic cream and a dry sterile dressing. -Patient was instructed to leave the dressing intact for today and begin soaking  in a weak solution of betadine and water tomorrow. Patient was instructed to  soak for 15 minutes each day and apply neosporin and a gauze or bandaid dressing each day. -Patient was instructed to monitor the toe for signs of infection and return to office if toe becomes red, hot or swollen. -Patient to return to office in 1-2 weeks for follow up/nail check or sooner if problems or questions arise.  Asencion Islam, DPM

## 2016-08-29 NOTE — Patient Instructions (Signed)

## 2016-09-05 ENCOUNTER — Encounter: Payer: Self-pay | Admitting: Sports Medicine

## 2016-09-05 ENCOUNTER — Ambulatory Visit (INDEPENDENT_AMBULATORY_CARE_PROVIDER_SITE_OTHER): Payer: BLUE CROSS/BLUE SHIELD | Admitting: Sports Medicine

## 2016-09-05 DIAGNOSIS — M79672 Pain in left foot: Secondary | ICD-10-CM

## 2016-09-05 DIAGNOSIS — Z9889 Other specified postprocedural states: Secondary | ICD-10-CM

## 2016-09-05 DIAGNOSIS — M722 Plantar fascial fibromatosis: Secondary | ICD-10-CM

## 2016-09-05 NOTE — Progress Notes (Signed)
Subjective: Jessica Williams is a 60 y.o. female patient returns to office today for follow up evaluation after having Left 2nd toe medial permanent nail avulsion performed on 08-29-16. Patient has been soaking using epsom and applying topical antibiotic covered with bandaid daily. Patient deniesfever/chills/nausea/vomitting/any other related constitutional symptoms at this time. Reports that her left heel is still doing good; occasional tingling pain to heel but much better than it was.   Patient Active Problem List   Diagnosis Date Noted  . Wellness examination 08/01/2016  . Essential hypertension 02/05/2016  . Gastroesophageal reflux disease without esophagitis 02/05/2016  . Hypercalcemia 02/05/2016  . IFG (impaired fasting glucose) 02/05/2016  . Long-term use of high-risk medication 02/05/2016  . Major depressive disorder, recurrent (HCC) 02/05/2016  . Malaise and fatigue 02/05/2016  . Mixed hyperlipidemia 02/05/2016  . Obese 02/05/2016    Current Outpatient Prescriptions on File Prior to Visit  Medication Sig Dispense Refill  . atenolol (TENORMIN) 50 MG tablet Take 50 mg by mouth.    Marland Kitchen. atorvastatin (LIPITOR) 40 MG tablet Take 40 mg by mouth.    Marland Kitchen. atorvastatin (LIPITOR) 40 MG tablet     . Cholecalciferol (VITAMIN D-1000 MAX ST) 1000 units tablet Take by mouth.    . Cinnamon 500 MG capsule Take 500 mg by mouth.    . citalopram (CELEXA) 10 MG tablet Take 20 mg by mouth.    . citalopram (CELEXA) 10 MG tablet     . diclofenac (VOLTAREN) 75 MG EC tablet Take 1 tablet (75 mg total) by mouth 2 (two) times daily. 30 tablet 0  . fexofenadine (ALLEGRA) 180 MG tablet Take 180 mg by mouth.    Marland Kitchen. lisinopril (PRINIVIL,ZESTRIL) 10 MG tablet     . meloxicam (MOBIC) 15 MG tablet Take 1 tablet (15 mg total) by mouth daily. 30 tablet 0  . predniSONE (DELTASONE) 10 MG tablet     . triamcinolone cream (KENALOG) 0.1 % Apply topically.    . triamcinolone cream (KENALOG) 0.1 % Apply topically.     Current  Facility-Administered Medications on File Prior to Visit  Medication Dose Route Frequency Provider Last Rate Last Dose  . triamcinolone acetonide (KENALOG) 10 MG/ML injection 10 mg  10 mg Other Once [employer] Quetzali Heinle, DPM      . triamcinolone acetonide (KENALOG-40) injection 20 mg  20 mg Other Once Asencion Islamitorya Karan Inclan, DPM        Allergies  Allergen Reactions  . Bacitracin Rash    Objective:  General: Well developed, nourished, in no acute distress, alert and oriented x3   Dermatology: Skin is warm, dry and supple bilateral. Left 2nd toe medial nail bed appears to be clean, dry, with mild granular tissue and surrounding eschar/scab. (-) Erythema. (+) Edema. (-) serosanguous drainage present. The remaining nails appear unremarkable at this time. There are no other lesions or other signs of infection present.  Neurovascular status: Intact. No lower extremity swelling; No pain with calf compression bilateral.  Musculoskeletal: Decreased tenderness to palpation of the Left 2nd toe medial nail fold(s). No acute reproducible tenderness to left heel. Muscular strength within normal limits bilateral.   Assesement and Plan: Problem List Items Addressed This Visit    None    Visit Diagnoses    S/P nail surgery    -  Primary   Foot pain, left       Plantar fasciitis of left foot         -Examined patient  -Cleansed left 2nd toe medial nail fold  and gently scrubbed with peroxide and q-tip/curetted away eschar at site and applied antibiotic cream covered with bandaid.  -Discussed plan of care with patient. -Patient to continue soaking in a weak solution of Epsom salt and warm water. Patient was instructed to soak for 15-20 minutes each day until the toe appears normal and there is no drainage, redness, tenderness, or swelling at the procedure site, and apply neosporin and a gauze or bandaid dressing each day as needed. May leave open to air at night. -Educated patient on long term care after nail  surgery. -Patient was instructed to monitor the toe for reoccurrence and signs of infection; Patient advised to return to office or go to ER if toe becomes red, hot or swollen. -Recommend continue with fascial brace for left, good supportive shoes, stretching, icing until next visit -Patient is to return in 4 weeks for final Left heel and toe re-check or sooner if problems arise.  Asencion Islam, DPM

## 2016-10-03 ENCOUNTER — Ambulatory Visit (INDEPENDENT_AMBULATORY_CARE_PROVIDER_SITE_OTHER): Payer: BLUE CROSS/BLUE SHIELD | Admitting: Sports Medicine

## 2016-10-03 ENCOUNTER — Encounter: Payer: Self-pay | Admitting: Sports Medicine

## 2016-10-03 DIAGNOSIS — M722 Plantar fascial fibromatosis: Secondary | ICD-10-CM

## 2016-10-03 DIAGNOSIS — Z9889 Other specified postprocedural states: Secondary | ICD-10-CM

## 2016-10-03 DIAGNOSIS — M79672 Pain in left foot: Secondary | ICD-10-CM | POA: Diagnosis not present

## 2016-10-03 NOTE — Progress Notes (Signed)
Subjective: Jessica Williams is a 60 y.o. female patient returns to office today for follow up evaluation after having Left 2nd toe medial permanent nail avulsion performed on 08-29-16. Patient has been soaking using epsom and applying topical antibiotic covered with bandaid daily and states that she stopped this about 1 week ago because the toe is healed and dry. Patient deniesfever/chills/nausea/vomitting/any other related constitutional symptoms at this time. Reports that her left heel is still doing good; no pain that is not managed.   Patient Active Problem List   Diagnosis Date Noted  . Wellness examination 08/01/2016  . Essential hypertension 02/05/2016  . Gastroesophageal reflux disease without esophagitis 02/05/2016  . Hypercalcemia 02/05/2016  . IFG (impaired fasting glucose) 02/05/2016  . Long-term use of high-risk medication 02/05/2016  . Major depressive disorder, recurrent (HCC) 02/05/2016  . Malaise and fatigue 02/05/2016  . Mixed hyperlipidemia 02/05/2016  . Obese 02/05/2016    Current Outpatient Prescriptions on File Prior to Visit  Medication Sig Dispense Refill  . atenolol (TENORMIN) 50 MG tablet Take 50 mg by mouth.    Marland Kitchen. atorvastatin (LIPITOR) 40 MG tablet Take 40 mg by mouth.    Marland Kitchen. atorvastatin (LIPITOR) 40 MG tablet     . Cholecalciferol (VITAMIN D-1000 MAX ST) 1000 units tablet Take by mouth.    . Cinnamon 500 MG capsule Take 500 mg by mouth.    . citalopram (CELEXA) 10 MG tablet Take 20 mg by mouth.    . citalopram (CELEXA) 10 MG tablet     . diclofenac (VOLTAREN) 75 MG EC tablet Take 1 tablet (75 mg total) by mouth 2 (two) times daily. 30 tablet 0  . fexofenadine (ALLEGRA) 180 MG tablet Take 180 mg by mouth.    Marland Kitchen. lisinopril (PRINIVIL,ZESTRIL) 10 MG tablet     . meloxicam (MOBIC) 15 MG tablet Take 1 tablet (15 mg total) by mouth daily. 30 tablet 0  . predniSONE (DELTASONE) 10 MG tablet     . triamcinolone cream (KENALOG) 0.1 % Apply topically.    . triamcinolone  cream (KENALOG) 0.1 % Apply topically.     Current Facility-Administered Medications on File Prior to Visit  Medication Dose Route Frequency Provider Last Rate Last Dose  . triamcinolone acetonide (KENALOG) 10 MG/ML injection 10 mg  10 mg Other Once [employer] Abrahm Mancia, DPM      . triamcinolone acetonide (KENALOG-40) injection 20 mg  20 mg Other Once Asencion Islamitorya Halina Asano, DPM        Allergies  Allergen Reactions  . Bacitracin Rash    Objective:  General: Well developed, nourished, in no acute distress, alert and oriented x3   Dermatology: Skin is warm, dry and supple bilateral. Left 2nd toe medial nail bed appears to be clean, dry, well healed with no signs of infection. The remaining nails appear unremarkable at this time. There are no other lesions or other signs of infection present.  Neurovascular status: Intact. No lower extremity swelling; No pain with calf compression bilateral.  Musculoskeletal: Decreased tenderness to palpation of the Left 2nd toe medial nail fold(s). No acute reproducible tenderness to left heel. Muscular strength within normal limits bilateral.   Assesement and Plan: Problem List Items Addressed This Visit    None    Visit Diagnoses    Plantar fasciitis of left foot    -  Primary   Foot pain, left       S/P nail surgery         -Examined patient  -Left 2nd toe  nail procedure site healed -Recommend wean from fascial brace on left heel as instructed -Recommend continue with good supportive shoes, stretching, daily -Patient is to return as needed or sooner if problems arise.  Asencion Islamitorya Inza Mikrut, DPM

## 2017-02-26 ENCOUNTER — Other Ambulatory Visit: Payer: Self-pay | Admitting: Orthopedic Surgery

## 2017-02-26 DIAGNOSIS — R52 Pain, unspecified: Secondary | ICD-10-CM

## 2017-03-13 ENCOUNTER — Ambulatory Visit
Admission: RE | Admit: 2017-03-13 | Discharge: 2017-03-13 | Disposition: A | Discharge status: Home / Self Care | Payer: Managed Care, Other (non HMO) | Source: Ambulatory Visit | Attending: Orthopedic Surgery | Admitting: Orthopedic Surgery

## 2017-03-13 DIAGNOSIS — R52 Pain, unspecified: Secondary | ICD-10-CM

## 2017-04-10 ENCOUNTER — Ambulatory Visit (INDEPENDENT_AMBULATORY_CARE_PROVIDER_SITE_OTHER): Payer: Managed Care, Other (non HMO) | Admitting: Allergy and Immunology

## 2017-04-10 ENCOUNTER — Encounter: Payer: Self-pay | Admitting: Allergy and Immunology

## 2017-04-10 VITALS — BP 112/76 | HR 76 | Temp 98.9°F | Resp 16 | Ht 66.0 in | Wt 191.0 lb

## 2017-04-10 DIAGNOSIS — L500 Allergic urticaria: Secondary | ICD-10-CM | POA: Diagnosis not present

## 2017-04-10 DIAGNOSIS — I10 Essential (primary) hypertension: Secondary | ICD-10-CM

## 2017-04-10 MED ORDER — LOSARTAN POTASSIUM 50 MG PO TABS: 50.0000 mg | ORAL_TABLET | Freq: Every day | ORAL | 1 refills | Status: DC

## 2017-04-10 MED ORDER — CETIRIZINE HCL 10 MG PO TABS: 10.0000 mg | ORAL_TABLET | Freq: Every day | ORAL | 1 refills | Status: AC

## 2017-04-10 NOTE — Progress Notes (Signed)
Dear Shary Decamp,  Thank you for referring Jessica Williams to the [facility] [place] on 04/10/2017.   Below is a summation of this patient's evaluation and recommendations.  Thank you for your referral. I will keep you informed about this patient's response to treatment.   If you have any questions please do not hesitate to contact me.   Sincerely,  Jessica Priest, MD Allergy / Immunology [facility]   ______________________________________________________________________    NEW PATIENT NOTE  Referring Provider: Gordan Payment., MD Primary Provider: Gordan Payment., MD Date of office visit: 04/10/2017    Subjective:   Chief Complaint:  Jessica Williams (DOB: [birth year]) is a 61 y.o. female who presents to the clinic on 04/10/2017 with a chief complaint of Urticaria and Oral Swelling .     HPI: Jessica Williams presents this clinic in evaluation of urticaria and angioedema of approximately 2 months duration.  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 Fortunately, this issue appears to have resolved around 2010 and she did well for 8 years but 2 months ago she started to develop red raised itchy lesions on her skin that would last about one or 2 days and never heal with scar or hyperpigmentation and were extremely itchy. In addition, she has had 4 episodes of lower lip swelling without any tongue involvement. She has no other associated systemic or constitutional symptoms.   There is no obvious provoking factor giving rise to this issue. She has not had a significant environmental change. She did start a probiotic about 4 months ago but she discontinued this agent 2 months ago. As well, she was using cinnamon supplementation for about 4 years but she  discontinued this agent 2 weeks ago. She does not use any antihistamines.   She did have her lisinopril removed recently but this did not appear to affect her lip swelling. Removal of lisinopril did result in loss of control of her blood pressure. Even in the face of having a replacement with amlodipine her blood pressure remains high.  Past Medical History:  Diagnosis Date  . Angio-edema   . Arthritis    "knees, back" (06/09/2013)  . Chronic lower back pain   . GERD (gastroesophageal reflux disease)    "in the past"  (06/09/2013)  . Hypercholesterolemia   . Hypertension   . PONV (postoperative nausea and vomiting)   . Urticaria     Past Surgical History:  Procedure Laterality Date  . DILATION AND CURETTAGE OF UTERUS  / 1998  . KNEE ARTHROSCOPY W/ MENISCAL REPAIR Right 09/2011   "and bone spur" (06/09/2013)  . POSTERIOR LUMBAR FUSION  06/09/2013  . REFRACTIVE SURGERY Bilateral 2005  . TONSILLECTOMY  ~ 1970  . TOTAL KNEE ARTHROPLASTY  06/14/2012   Procedure: TOTAL KNEE ARTHROPLASTY;  Surgeon: Raymon Mutton, MD;  Location: [facility] OR;  Service: Orthopedics;  Laterality: Right;  Marland Kitchen VAGINAL HYSTERECTOMY  2000    Allergies as of 04/10/2017      Reactions   Bacitracin Rash      Medication List      amLODipine 5 MG tablet Commonly known as:  NORVASC Take 5 mg by mouth.   atenolol 50 MG tablet Commonly known as:  TENORMIN Take 50 mg by mouth.   atorvastatin 40 MG  tablet Commonly known as:  LIPITOR Take 40 mg by mouth.   Cinnamon 500 MG capsule Take 500 mg by mouth.   diclofenac 75 MG EC tablet Commonly known as:  VOLTAREN Take 1 tablet (75 mg total) by mouth 2 (two) times daily.   VITAMIN D-1000 MAX ST 1000 units tablet Generic drug:  Cholecalciferol Take by mouth.       Review of systems negative except as noted in HPI / PMHx or noted below:  Review of Systems  Constitutional: Negative.   HENT: Negative.   Eyes: Negative.   Respiratory: Negative.     Cardiovascular: Negative.   Gastrointestinal: Negative.   Genitourinary: Negative.   Musculoskeletal: Negative.   Skin: Negative.   Neurological: Negative.   Endo/Heme/Allergies: Negative.   Psychiatric/Behavioral: Negative.     Family History  Problem Relation Age of Onset  . Allergic rhinitis Neg Hx   . Angioedema Neg Hx   . Asthma Neg Hx   . Atopy Neg Hx   . Eczema Neg Hx   . Immunodeficiency Neg Hx   . Urticaria Neg Hx     Social History   Social History  . Marital status: Married    Spouse name: N/A  . Number of children: N/A  . Years of education: N/A   Occupational History  . Not on file.   Social History Main Topics  . Smoking status: Never Smoker  . Smokeless tobacco: Never Used  . Alcohol use No  . Drug use: No  . Sexual activity: Yes    Birth control/ protection: Post-menopausal   Other Topics Concern  . Not on file   Social History Narrative  . No narrative on file    Environmental and Social history  Lives in a mobile home with a dry environment, no pets located inside the household, carpeting in the bedroom, plastic on the bed and pillow, and no smoking ongoing with inside the household. She has worked at [employer] for 41 years and has been doing the same job for 17 years.    Objective:   Vitals:   04/10/17 0905 04/10/17 0932  BP: (!) 150/100 112/76  Pulse: 76   Resp: 16   Temp: 98.9 F (37.2 C)    Height: 5\' 6"  (167.6 cm) Weight: 191 lb (86.6 kg)  Physical Exam  Constitutional: She is well-developed, well-nourished, and in no distress.  HENT:  Head: Normocephalic. Head is without right periorbital erythema and without left periorbital erythema.  Right Ear: Tympanic membrane, external ear and ear canal normal.  Left Ear: Tympanic membrane, external ear and ear canal normal.  Nose: Nose normal. No mucosal edema or rhinorrhea.  Mouth/Throat: Uvula is midline, oropharynx is clear and moist and mucous membranes are normal. No  oropharyngeal exudate.  Eyes: Conjunctivae and lids are normal. Pupils are equal, round, and reactive to light.  Neck: Trachea normal. No tracheal tenderness present. No tracheal deviation present. No thyromegaly present.  Cardiovascular: Normal rate, regular rhythm, S1 normal, S2 normal and normal heart sounds.   No murmur heard. Pulmonary/Chest: Effort normal and breath sounds normal. No stridor. No tachypnea. No respiratory distress. She has no wheezes. She has no rales. She exhibits no tenderness.  Abdominal: Soft. She exhibits no distension and no mass. There is no hepatosplenomegaly. There is no tenderness. There is no rebound and no guarding.  Musculoskeletal: She exhibits no edema or tenderness.  Lymphadenopathy:       Head (right side): No tonsillar adenopathy present.  Head (left side): No tonsillar adenopathy present.    She has no cervical adenopathy.    She has no axillary adenopathy.  Neurological: She is alert. Gait normal.  Skin: No rash noted. She is not diaphoretic. No erythema. No pallor. Nails show no clubbing.  Psychiatric: Mood and affect normal.    Diagnostics: Allergy skin tests were not performed.   Results of blood tests obtained 04/01/2017 identified normal hepatic and renal function, white blood cell count 6.6, hemoglobin 14.3, platelet 165.   Assessment and Plan:    1. Allergic urticaria   2. Essential hypertension     1. Every day take cetirizine 10 mg daily  2. Treat blood pressure with the following:   A. start losartan 50 mg one tablet one time per day  B. stop amlodipine  C. continue atenolol  D. check blood pressure  3. Can add Benadryl if needed  4. Further evaluation? Yes, if unresponsive to treatment  5. Contact clinic with update next week  Jessica Williams has redeveloped immunologic hyperreactivity manifested as urticaria and lip swelling with unknown etiologic factor. Evaluation in the past did not identify any specific etiologic agent  other than autoimmune urticaria and we will assume that she has redeveloped this issue and have her use an antihistamine on a daily basis. If she does well with an antihistamine there will be no need for any further evaluation. If she does not do well with a very safe medication designed to decrease her immunological hyperreactivity then she is going to require further evaluation and treatment. As well, her blood pressure is out of control. I believe it was a good move to discontinue her lisinopril but her amlodipine does not appear to be effective in controlling her blood pressure and I have asked her to exchange her amlodipine for losartan and she can follow-up with her primary care doctor concerning further management of this issue. She will contact me next week with her response to this approach and we will make a decision about how to proceed based upon his response.  Jessica PriestEric J. Lynlee Stratton, MD [facility] [place]

## 2017-04-10 NOTE — Patient Instructions (Addendum)
  1. Every day take cetirizine 10 mg daily  2. Treat blood pressure with the following:   A. start losartan 50 mg one tablet one time per day  B. stop amlodipine  C. continue atenolol  D. check blood pressure  3. Can add Benadryl if needed  4. Further evaluation? Yes, if unresponsive to treatment  5. Contact clinic with update next week

## 2017-05-17 HISTORY — PX: ROTATOR CUFF REPAIR: SHX139

## 2017-06-01 ENCOUNTER — Other Ambulatory Visit: Payer: Self-pay | Admitting: *Deleted

## 2017-06-01 MED ORDER — LOSARTAN POTASSIUM 50 MG PO TABS: 50.0000 mg | ORAL_TABLET | Freq: Every day | ORAL | 0 refills | Status: DC

## 2017-07-01 ENCOUNTER — Other Ambulatory Visit: Payer: Self-pay | Admitting: Allergy and Immunology

## 2017-09-25 ENCOUNTER — Other Ambulatory Visit: Payer: Self-pay | Admitting: Orthopedic Surgery

## 2017-09-25 DIAGNOSIS — R52 Pain, unspecified: Secondary | ICD-10-CM

## 2017-10-04 ENCOUNTER — Ambulatory Visit
Admission: RE | Admit: 2017-10-04 | Discharge: 2017-10-04 | Disposition: A | Discharge status: Home / Self Care | Payer: Managed Care, Other (non HMO) | Source: Ambulatory Visit | Attending: Orthopedic Surgery | Admitting: Orthopedic Surgery

## 2017-10-04 DIAGNOSIS — R52 Pain, unspecified: Secondary | ICD-10-CM

## 2017-10-05 ENCOUNTER — Encounter (HOSPITAL_COMMUNITY): Payer: Self-pay | Admitting: Diagnostic Radiology

## 2019-01-08 IMAGING — MR MR SHOULDER*R* W/O CM
4 of 5 series · 27 of 40 positions shown · non-contrast
Comparison: None.

CLINICAL DATA: Right shoulder pain for 1.5 years.  No known injury.

EXAM:
MRI OF THE RIGHT SHOULDER WITHOUT CONTRAST
TECHNIQUE: Multiplanar, multisequence MR imaging of the shoulder was performed.
No intravenous contrast was administered.

[Series 3: T2 fat-sat · axial · 4.0mm · 0.55mm/px · z∈[-23,+66]mm · 8 of 20 slices shown (1 of 3)]
[im 1/20]
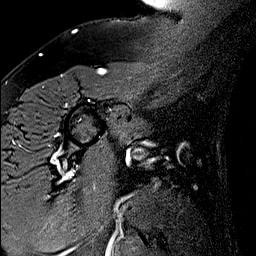
[im 3/20]
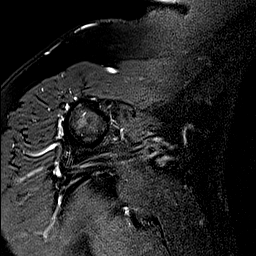
[im 6/20]
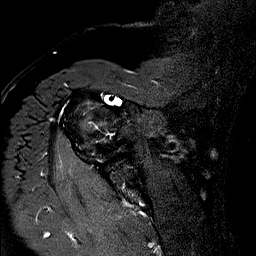
[im 9/20]
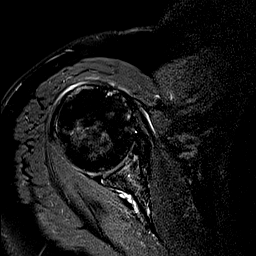
[im 11/20]
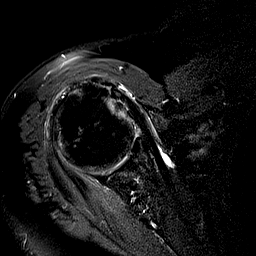
[im 14/20]
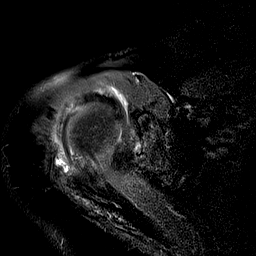
[im 17/20]
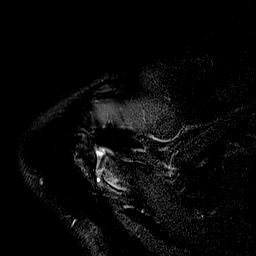
[im 20/20]
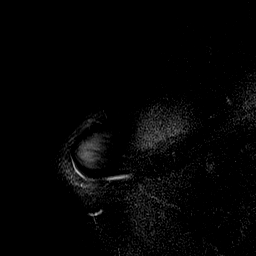

[Series 4: T2 fat-sat · oblique · 4.0mm · 0.55mm/px · 9 of 20 slices shown (2 of 3)]
[im 1/20]
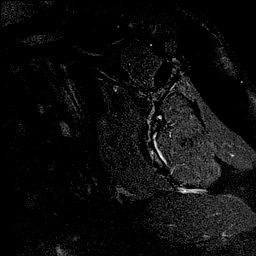
[im 3/20]
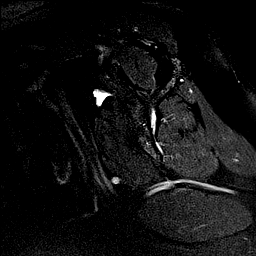
[im 5/20]
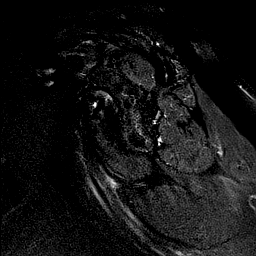
[im 8/20]
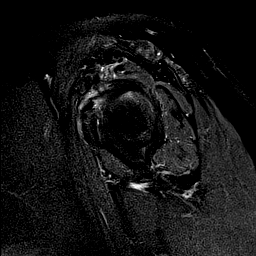
[im 10/20]
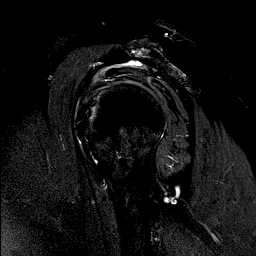
[im 12/20]
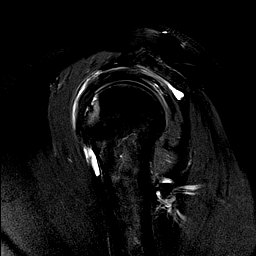
[im 15/20]
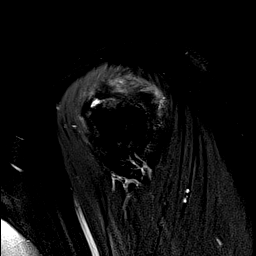
[im 17/20]
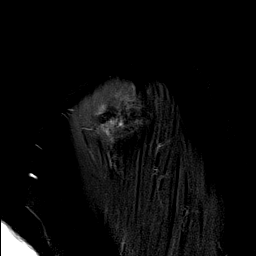
[im 20/20]
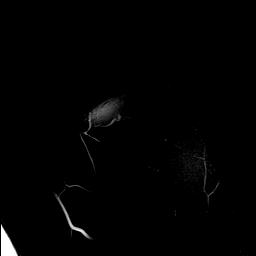

[Series 6: T2 fat-sat · oblique · 4.0mm · 0.27mm/px · 3 of 17 slices shown (3 of 3)]
[im 3/17]
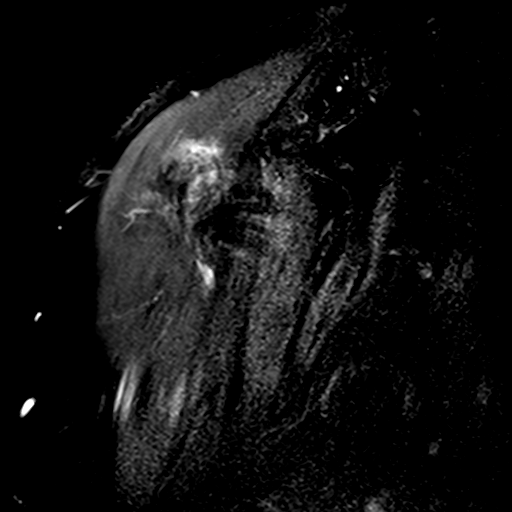
[im 9/17]
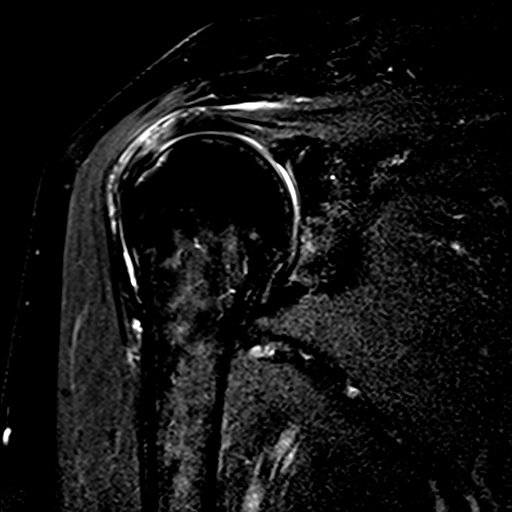
[im 14/17]
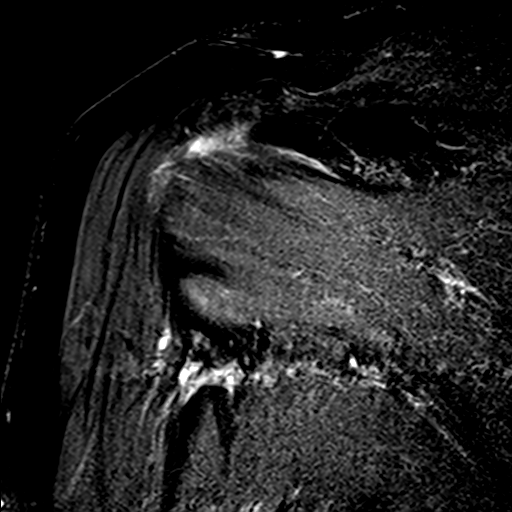

[Series 7: PD · oblique · 4.0mm · 0.22mm/px · 7 of 17 slices shown]
[im 1/17]
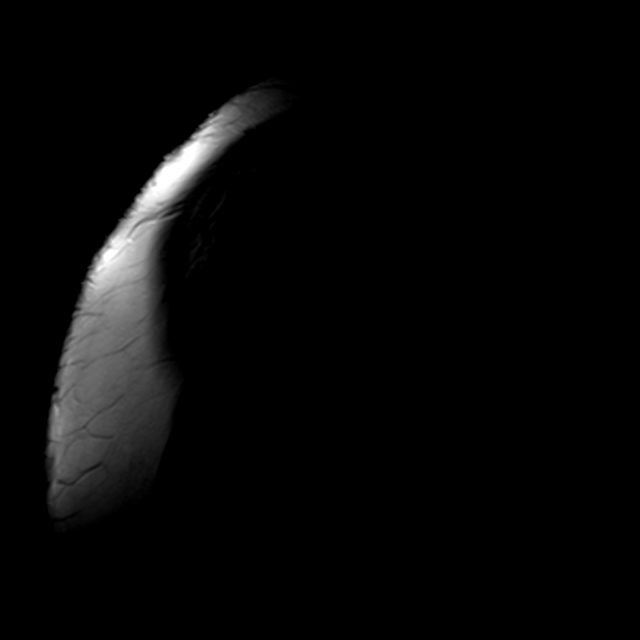
[im 3/17]
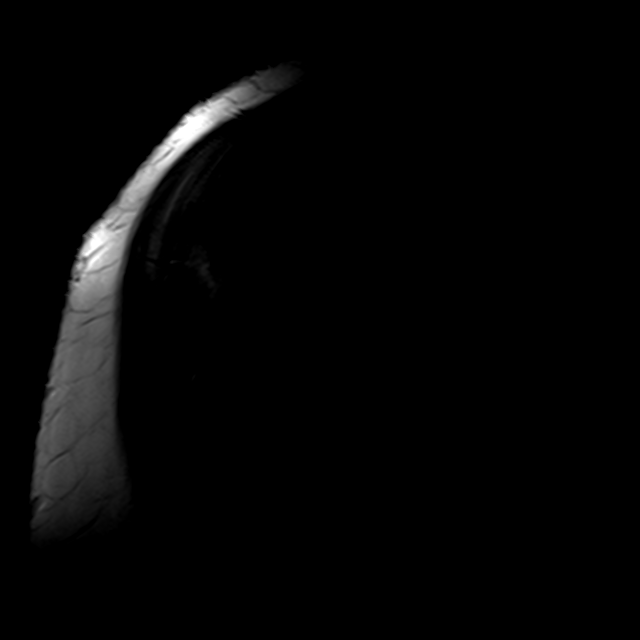
[im 6/17]
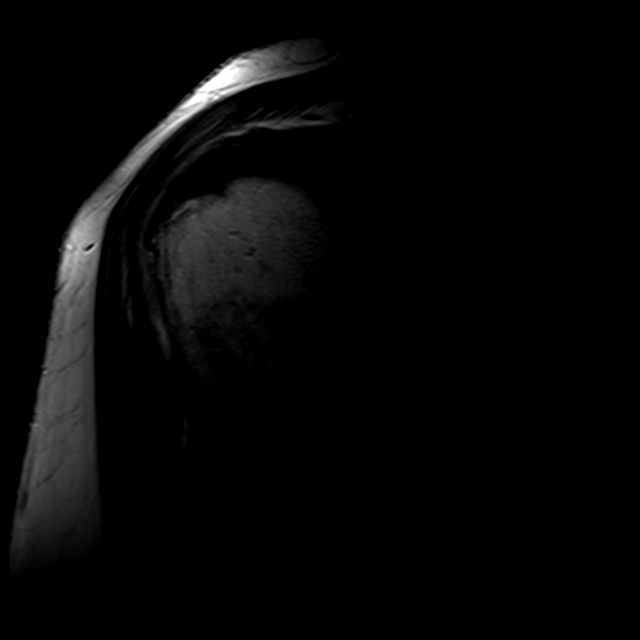
[im 9/17]
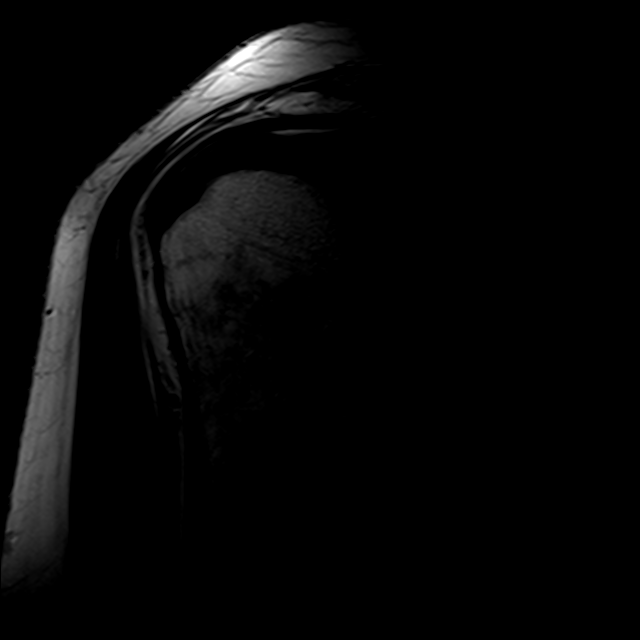
[im 11/17]
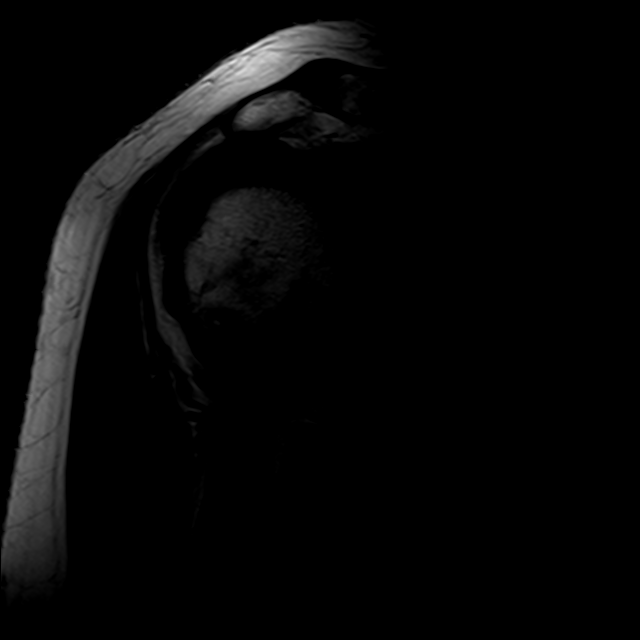
[im 14/17]
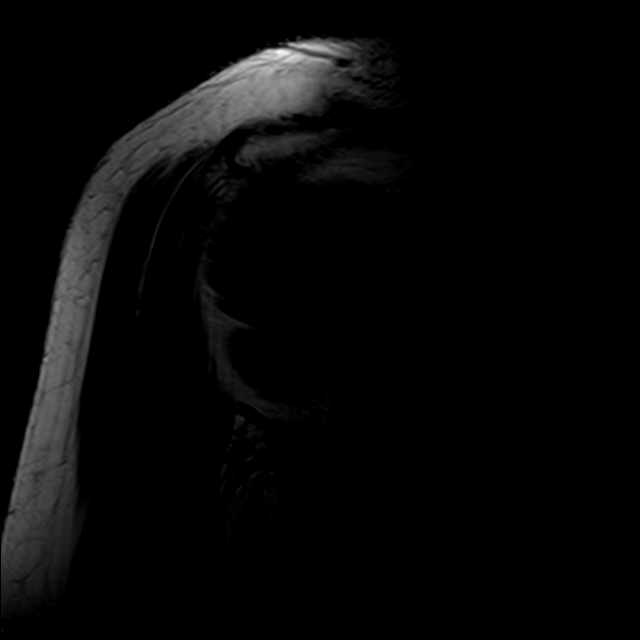
[im 17/17]
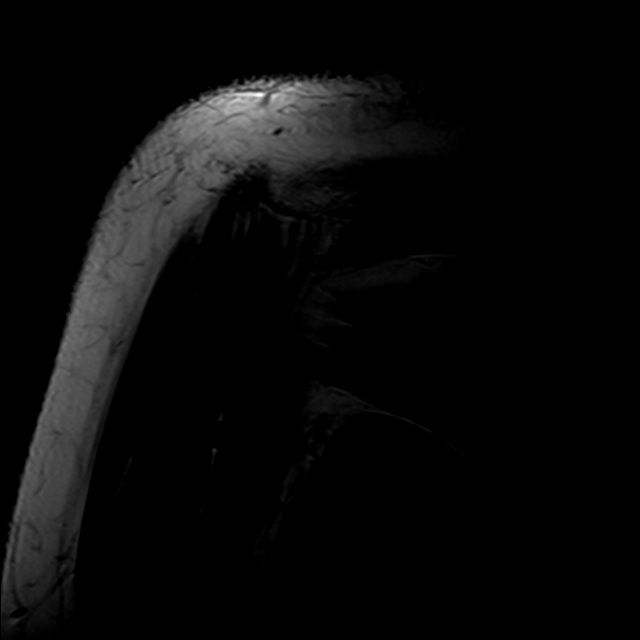

[27 of 40 positions shown; findings below may reference images not displayed]

FINDINGS: Rotator cuff: Supraspinatus worse than infraspinatus and
subscapularis tendinopathy is seen. A full-thickness tear of the
leading edge of the supraspinatus measures 0.7 cm from front to
back. Retraction is 1 cm or less. The rotator cuff is otherwise
intact.

Muscles:  No atrophy or focal lesion.

Biceps long head: Severe appearing tendinopathy of the
intra-articular segment is identified. The tendon is intact.

Acromioclavicular Joint: Mild to moderate degenerative change is
seen. Type 1 acromion. There is fluid in the subacromial/subdeltoid
bursa.

Glenohumeral Joint: There is some cartilage thinning and a small
osteophyte off the medial humeral head. Mild subchondral edema
anterior, inferior glenoid is noted.

Labrum:  Intact.

Bones:  No fracture or worrisome lesion.

Other: None.
IMPRESSION: Rotator cuff tendinopathy appears worst in the supraspinatus where
there is a 0.7 cm full-thickness tear of the leading edge with the
tendon. Retraction is mild at 1 cm or less and there is no atrophy.

Severe appearing tendinopathy of the intra-articular long long head
of biceps.

Mild acromioclavicular and mild to moderate glenohumeral
osteoarthritis.

Subacromial/subdeltoid fluid consistent with bursitis.

## 2019-01-08 IMAGING — MR MR SHOULDER*L* W/O CM
5 series · 33 of 40 positions shown · non-contrast
Comparison: None.

CLINICAL DATA: Right shoulder pain for 1.5 years.  No known injury.

EXAM:
MRI OF THE LEFT SHOULDER WITHOUT CONTRAST
TECHNIQUE: Multiplanar, multisequence MR imaging of the shoulder was performed.
No intravenous contrast was administered.

[Series 4: T2 fat-sat · oblique · 4.0mm · 0.55mm/px · 8 of 20 slices shown (1 of 3)]
[im 1/20]
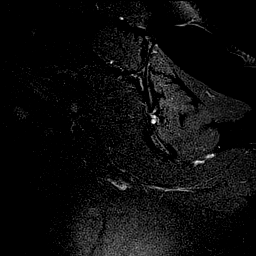
[im 3/20]
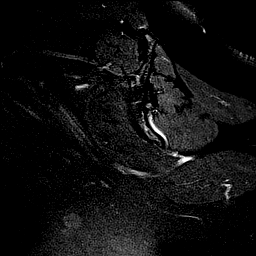
[im 6/20]
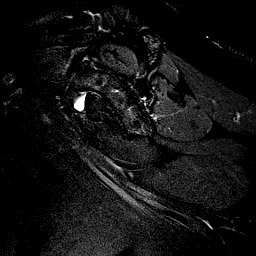
[im 9/20]
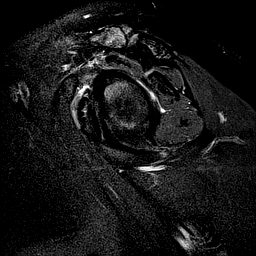
[im 11/20]
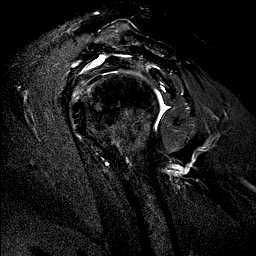
[im 14/20]
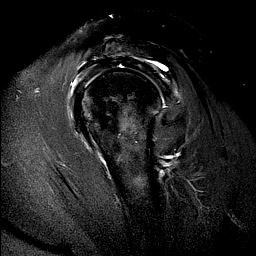
[im 17/20]
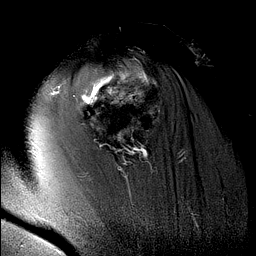
[im 20/20]
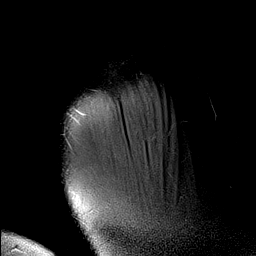

[Series 5: T1 · oblique · 4.0mm · 0.22mm/px · 2 of 20 slices shown]
[im 1/20]
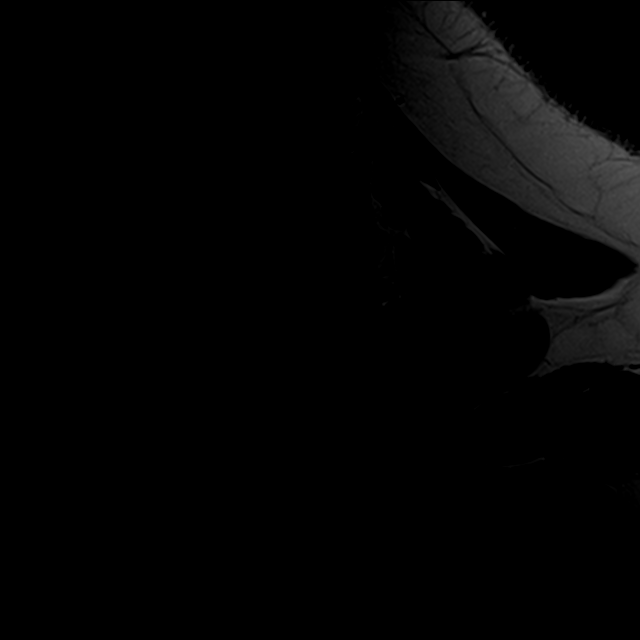
[im 3/20]
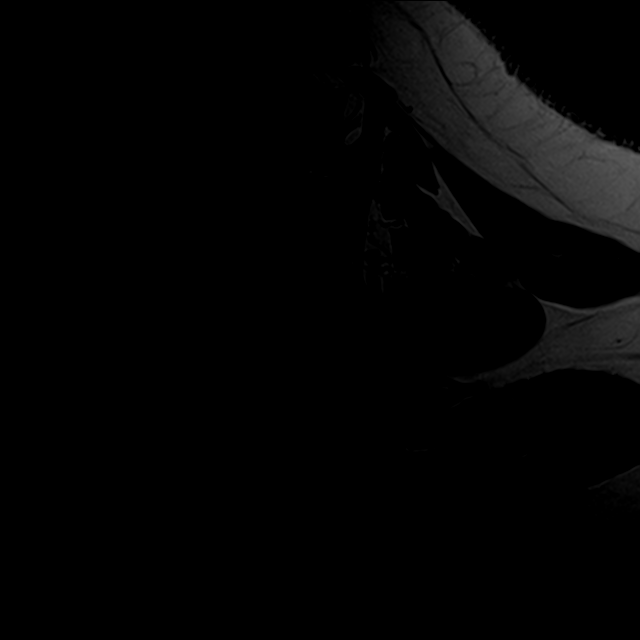

[Series 6: T2 fat-sat · oblique · 4.0mm · 0.27mm/px · 7 of 17 slices shown (2 of 3)]
[im 1/17]
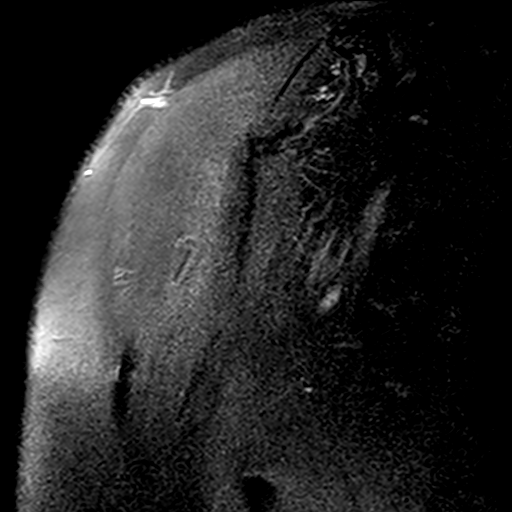
[im 3/17]
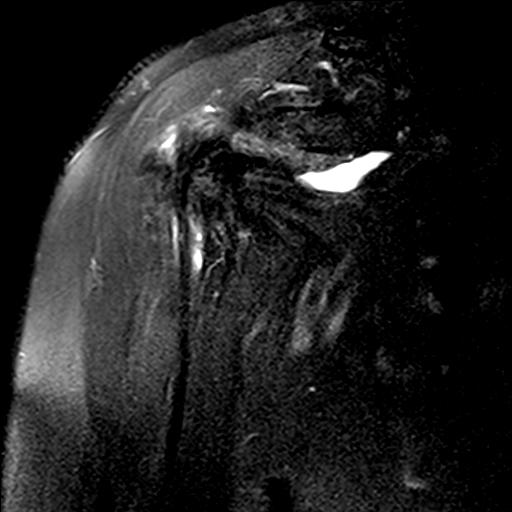
[im 6/17]
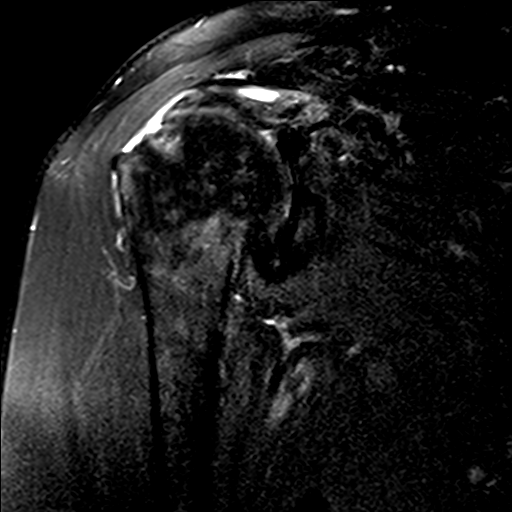
[im 9/17]
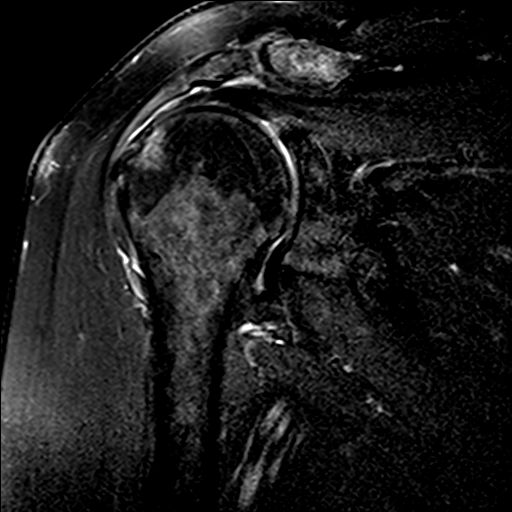
[im 11/17]
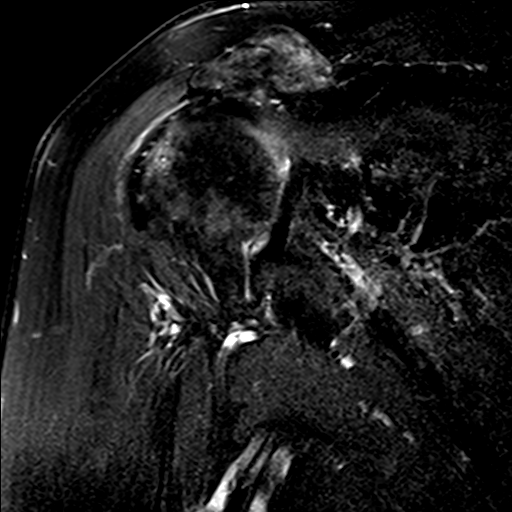
[im 14/17]
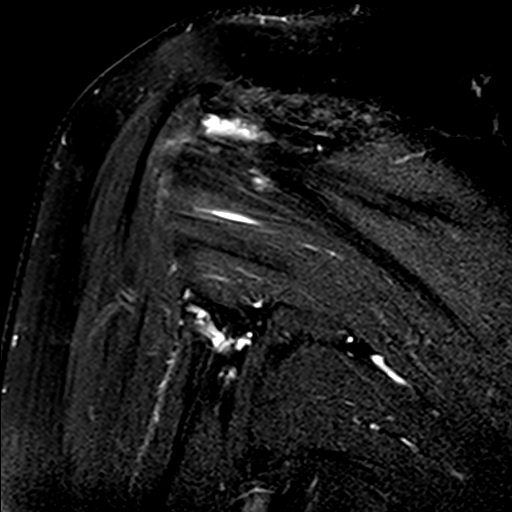
[im 17/17]
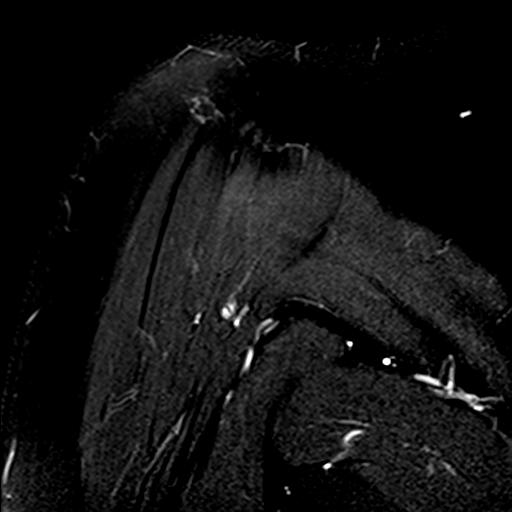

[Series 7: PD · oblique · 4.0mm · 0.44mm/px · 7 of 17 slices shown]
[im 1/17]
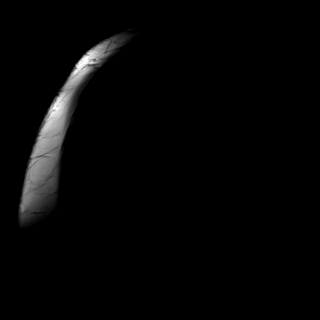
[im 3/17]
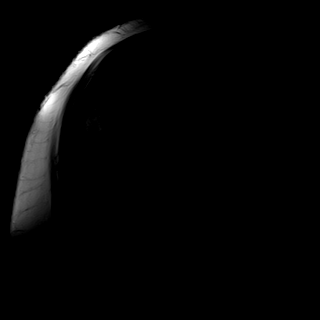
[im 6/17]
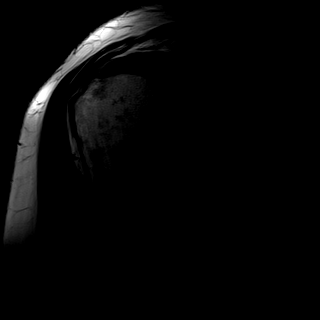
[im 9/17]
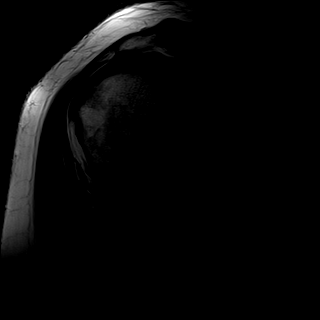
[im 11/17]
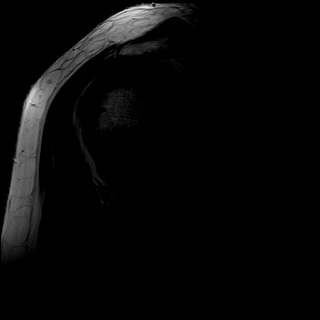
[im 14/17]
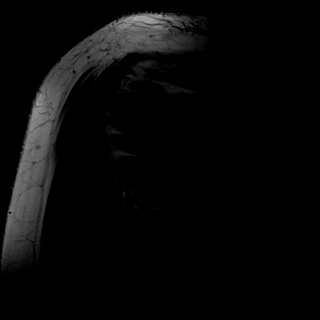
[im 17/17]
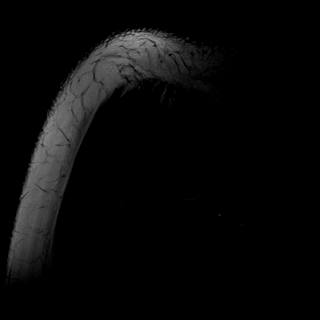

[Series 8: T2 fat-sat · axial · 4.0mm · 0.55mm/px · z∈[-71,+9]mm · 9 of 20 slices shown (3 of 3)]
[im 1/20]
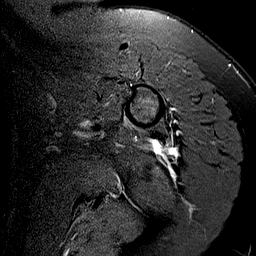
[im 3/20]
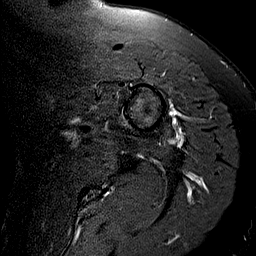
[im 5/20]
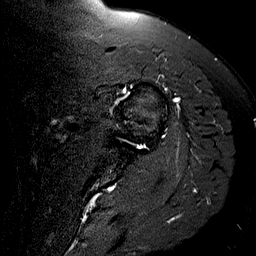
[im 8/20]
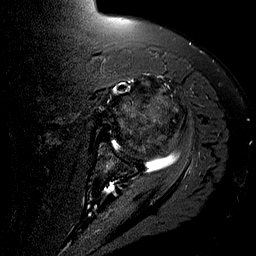
[im 10/20]
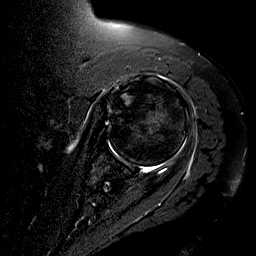
[im 12/20]
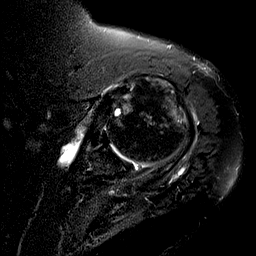
[im 15/20]
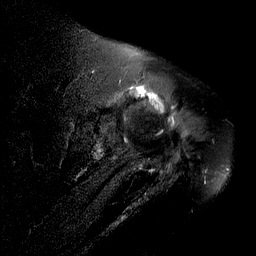
[im 17/20]
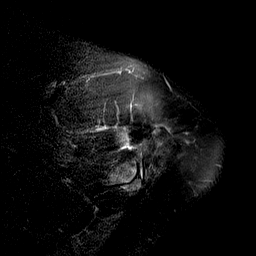
[im 20/20]
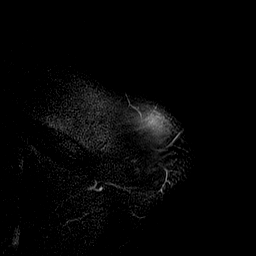

[33 of 40 positions shown; findings below may reference images not displayed]

FINDINGS: Rotator cuff: The patient has rotator cuff tendinopathy. There is a
partial width, full-thickness tear of the anterior and far lateral
supraspinatus measuring approximately 1.5 cm from front to back.
Retraction is to the top of the humeral head, 2-3 cm.

Muscles:  No focal atrophy or lesion.

Biceps long head: Tendinopathy of the intra-articular segment
without tear is identified.

Acromioclavicular Joint: Moderate degenerative change is seen. Type
1 acromion. Prominent subacromial spur noted. There is fluid in the
subacromial/subdeltoid bursa.

Glenohumeral Joint: Cartilage thinning is noted.

Labrum:  Intact.

Bones:  No fracture or worrisome lesion.

Other: None.
IMPRESSION: Rotator cuff tendinopathy with a 1.5 cm from front to back
full-thickness tear of the leading edge of the supraspinatus.
Retraction is 2-3 cm. No atrophy.

Tendinopathy of the intra-articular long head of biceps without
tear.

Moderate acromioclavicular osteoarthritis. Subacromial spurring also
noted.

Subacromial/subdeltoid fluid consistent with bursitis.

## 2019-01-21 ENCOUNTER — Other Ambulatory Visit: Payer: Self-pay | Admitting: Orthopedic Surgery

## 2019-01-21 DIAGNOSIS — M25512 Pain in left shoulder: Principal | ICD-10-CM

## 2019-01-21 DIAGNOSIS — G8929 Other chronic pain: Secondary | ICD-10-CM

## 2019-01-29 ENCOUNTER — Other Ambulatory Visit: Payer: Self-pay

## 2019-01-29 ENCOUNTER — Ambulatory Visit
Admission: RE | Admit: 2019-01-29 | Discharge: 2019-01-29 | Disposition: A | Discharge status: Home / Self Care | Payer: BLUE CROSS/BLUE SHIELD | Source: Ambulatory Visit | Attending: Orthopedic Surgery | Admitting: Orthopedic Surgery

## 2019-01-29 DIAGNOSIS — G8929 Other chronic pain: Secondary | ICD-10-CM

## 2019-01-29 DIAGNOSIS — M25512 Pain in left shoulder: Principal | ICD-10-CM

## 2023-10-21 ENCOUNTER — Other Ambulatory Visit (HOSPITAL_COMMUNITY): Payer: Self-pay

## 2024-01-21 ENCOUNTER — Other Ambulatory Visit (HOSPITAL_COMMUNITY): Payer: Self-pay

## 2024-01-21 MED ORDER — JANUVIA 50 MG PO TABS
50.0000 mg | ORAL_TABLET | Freq: Every day | ORAL | 3 refills | Status: AC
  Filled 2024-01-21: qty 90, 90d supply, fill #0
  Filled 2024-04-27: qty 90, 90d supply, fill #1
  Filled 2024-08-10: qty 90, 90d supply, fill #2
  Filled 2024-11-03: qty 90, 90d supply, fill #3

## 2024-01-22 ENCOUNTER — Other Ambulatory Visit (HOSPITAL_COMMUNITY): Payer: Self-pay

## 2024-02-16 ENCOUNTER — Other Ambulatory Visit (HOSPITAL_COMMUNITY): Payer: Self-pay

## 2024-02-16 ENCOUNTER — Other Ambulatory Visit: Payer: Self-pay

## 2024-02-16 MED ORDER — CITALOPRAM HYDROBROMIDE 20 MG PO TABS
30.0000 mg | ORAL_TABLET | Freq: Every day | ORAL | 0 refills | Status: AC
  Filled 2024-02-16: qty 135, 90d supply, fill #0

## 2024-02-16 MED ORDER — ATORVASTATIN CALCIUM 40 MG PO TABS
40.0000 mg | ORAL_TABLET | Freq: Every day | ORAL | 0 refills | Status: DC
  Filled 2024-02-16: qty 90, 90d supply, fill #0

## 2024-02-16 MED ORDER — FLUTICASONE PROPIONATE 50 MCG/ACT NA SUSP
1.0000 | Freq: Every day | NASAL | 0 refills | Status: DC
Start: 2024-02-16 — End: 2024-06-21
  Filled 2024-02-16: qty 16, 60d supply, fill #0
  Filled 2024-04-13: qty 16, 60d supply, fill #1

## 2024-02-16 MED ORDER — MELOXICAM 15 MG PO TABS
15.0000 mg | ORAL_TABLET | Freq: Every day | ORAL | 0 refills | Status: AC
  Filled 2024-02-16: qty 90, 90d supply, fill #0

## 2024-02-16 MED ORDER — ATENOLOL 50 MG PO TABS
50.0000 mg | ORAL_TABLET | Freq: Every day | ORAL | 0 refills | Status: AC
  Filled 2024-02-16: qty 90, 90d supply, fill #0

## 2024-02-16 MED ORDER — LOSARTAN POTASSIUM 50 MG PO TABS
50.0000 mg | ORAL_TABLET | Freq: Every day | ORAL | 0 refills | Status: DC
  Filled 2024-02-16: qty 90, 90d supply, fill #0

## 2024-04-13 ENCOUNTER — Other Ambulatory Visit: Payer: Self-pay

## 2024-04-18 ENCOUNTER — Other Ambulatory Visit (HOSPITAL_COMMUNITY): Payer: Self-pay

## 2024-04-27 ENCOUNTER — Other Ambulatory Visit (HOSPITAL_COMMUNITY): Payer: Self-pay

## 2024-06-01 ENCOUNTER — Other Ambulatory Visit (HOSPITAL_COMMUNITY): Payer: Self-pay

## 2024-06-01 MED ORDER — ATORVASTATIN CALCIUM 40 MG PO TABS
40.0000 mg | ORAL_TABLET | Freq: Every day | ORAL | 3 refills | Status: AC
  Filled 2024-06-01 – 2024-06-02 (×2): qty 90, 90d supply, fill #0
  Filled 2024-08-31: qty 90, 90d supply, fill #1
  Filled 2024-11-30: qty 90, 90d supply, fill #2

## 2024-06-02 ENCOUNTER — Other Ambulatory Visit (HOSPITAL_COMMUNITY): Payer: Self-pay

## 2024-06-21 ENCOUNTER — Other Ambulatory Visit (HOSPITAL_COMMUNITY): Payer: Self-pay

## 2024-06-21 ENCOUNTER — Other Ambulatory Visit: Payer: Self-pay

## 2024-06-21 MED ORDER — FLUTICASONE PROPIONATE 50 MCG/ACT NA SUSP
1.0000 | Freq: Every day | NASAL | 1 refills | Status: AC
Start: 2024-06-21 — End: ?
  Filled 2024-06-21: qty 32, 60d supply, fill #0
  Filled 2024-08-31: qty 32, 60d supply, fill #1

## 2024-07-19 ENCOUNTER — Other Ambulatory Visit: Payer: Self-pay

## 2024-07-19 ENCOUNTER — Other Ambulatory Visit (HOSPITAL_COMMUNITY): Payer: Self-pay

## 2024-07-19 MED ORDER — MELOXICAM 15 MG PO TABS
15.0000 mg | ORAL_TABLET | Freq: Every day | ORAL | 3 refills | Status: AC
  Filled 2024-07-19: qty 90, 90d supply, fill #0
  Filled 2024-10-12: qty 90, 90d supply, fill #1

## 2024-07-19 MED ORDER — CITALOPRAM HYDROBROMIDE 20 MG PO TABS
30.0000 mg | ORAL_TABLET | Freq: Every day | ORAL | 3 refills | Status: AC
  Filled 2024-07-19: qty 135, 90d supply, fill #0
  Filled 2024-11-03: qty 135, 90d supply, fill #1

## 2024-07-19 MED ORDER — ATENOLOL 50 MG PO TABS
50.0000 mg | ORAL_TABLET | Freq: Every day | ORAL | 3 refills | Status: AC
  Filled 2024-07-19: qty 90, 90d supply, fill #0

## 2024-08-10 ENCOUNTER — Other Ambulatory Visit (HOSPITAL_COMMUNITY): Payer: Self-pay

## 2024-08-31 ENCOUNTER — Other Ambulatory Visit (HOSPITAL_COMMUNITY): Payer: Self-pay

## 2024-10-12 ENCOUNTER — Other Ambulatory Visit (HOSPITAL_COMMUNITY): Payer: Self-pay

## 2024-10-12 ENCOUNTER — Other Ambulatory Visit: Payer: Self-pay

## 2024-10-12 MED ORDER — LOSARTAN POTASSIUM 50 MG PO TABS
50.0000 mg | ORAL_TABLET | Freq: Every day | ORAL | 1 refills | Status: AC
  Filled 2024-10-12: qty 90, 90d supply, fill #0

## 2024-11-03 ENCOUNTER — Other Ambulatory Visit: Payer: Self-pay

## 2024-11-03 MED ORDER — FLUTICASONE PROPIONATE 50 MCG/ACT NA SUSP
1.0000 | Freq: Every day | NASAL | 1 refills | Status: AC
  Filled 2024-11-03: qty 16, 60d supply, fill #0

## 2024-11-30 ENCOUNTER — Other Ambulatory Visit (HOSPITAL_COMMUNITY): Payer: Self-pay
